# Patient Record
Sex: Female | Born: 1965 | Race: White | Hispanic: No | Smoking: Never smoker
Health system: Southern US, Community
[De-identification: ages and names within clinical notes are randomized; demographics above are authoritative.]

## PROBLEM LIST (undated history)

## (undated) DIAGNOSIS — E119 Type 2 diabetes mellitus without complications: Secondary | ICD-10-CM

## (undated) DIAGNOSIS — F419 Anxiety disorder, unspecified: Secondary | ICD-10-CM

## (undated) HISTORY — PX: TUBAL LIGATION: SHX77

## (undated) HISTORY — DX: Type 2 diabetes mellitus without complications: E11.9

---

## 2010-05-12 ENCOUNTER — Ambulatory Visit: Payer: Self-pay | Admitting: Internal Medicine

## 2011-07-23 ENCOUNTER — Ambulatory Visit: Payer: Self-pay | Admitting: Internal Medicine

## 2012-04-27 ENCOUNTER — Ambulatory Visit: Payer: Self-pay | Admitting: Psychiatry

## 2012-04-27 LAB — CBC WITH DIFFERENTIAL/PLATELET
Basophil #: 0 10*3/uL (ref 0.0–0.1)
Eosinophil #: 0.3 10*3/uL (ref 0.0–0.7)
HCT: 36.5 % (ref 35.0–47.0)
Lymphocyte %: 24.9 %
MCHC: 34.5 g/dL (ref 32.0–36.0)
Monocyte #: 0.6 x10 3/mm (ref 0.2–0.9)
Monocyte %: 7.1 %
Neutrophil #: 5 10*3/uL (ref 1.4–6.5)
Neutrophil %: 64 %
Platelet: 275 10*3/uL (ref 150–440)
RDW: 14.1 % (ref 11.5–14.5)
WBC: 7.8 10*3/uL (ref 3.6–11.0)

## 2012-04-27 LAB — BASIC METABOLIC PANEL
Chloride: 99 mmol/L (ref 98–107)
Co2: 26 mmol/L (ref 21–32)
Osmolality: 274 (ref 275–301)
Potassium: 3.6 mmol/L (ref 3.5–5.1)

## 2012-04-27 LAB — ALT: SGPT (ALT): 68 U/L

## 2012-08-23 ENCOUNTER — Ambulatory Visit: Payer: Self-pay | Admitting: Internal Medicine

## 2013-01-10 IMAGING — MG MMM DGT SCR NO ORDER W/CAD
1 series · 6 of 6 positions shown · non-contrast
Comparison: none

REASON FOR EXAM: SCRN   NO ORDER
COMMENTS:

[Series 2746: R CC · right · 6 of 6 slices shown]
[im 1/6]
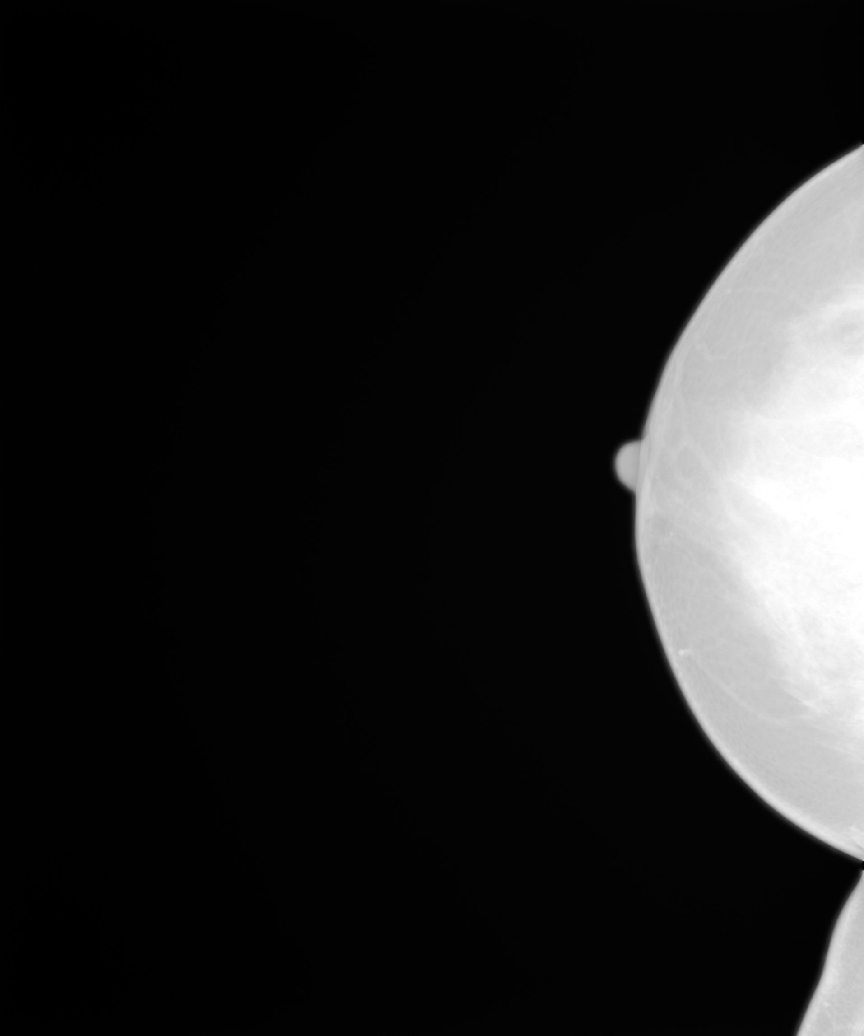
[im 2/6]
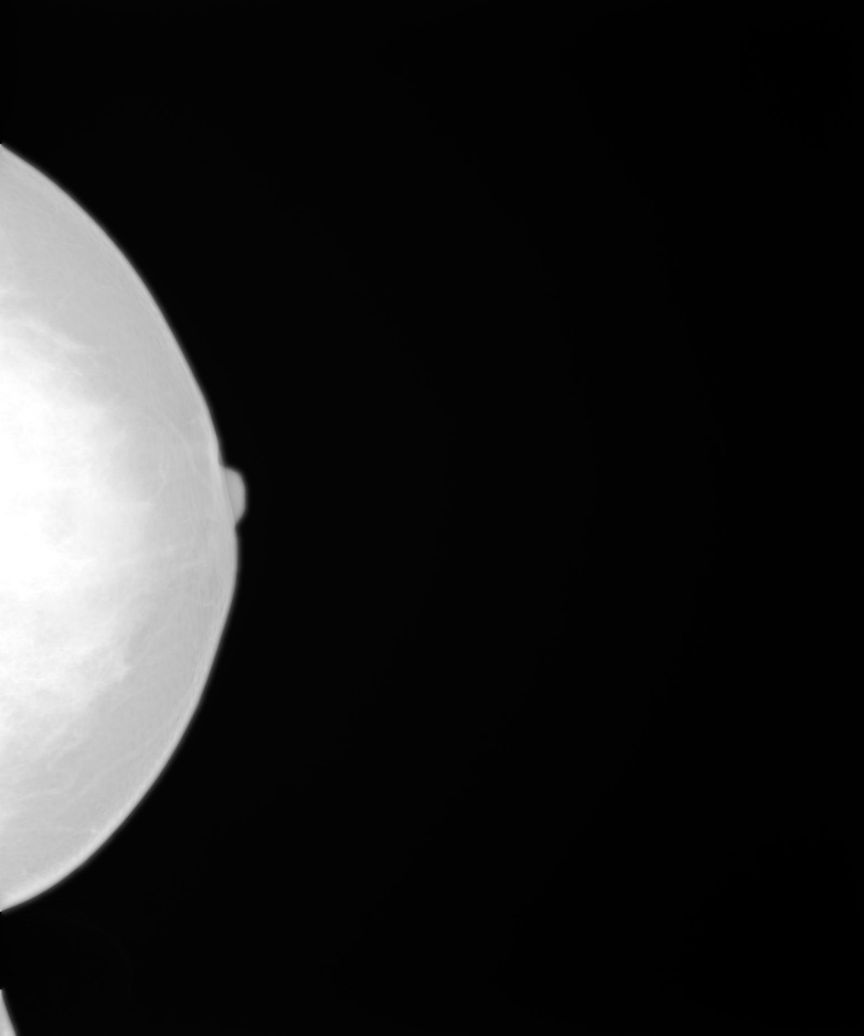
[im 3/6]
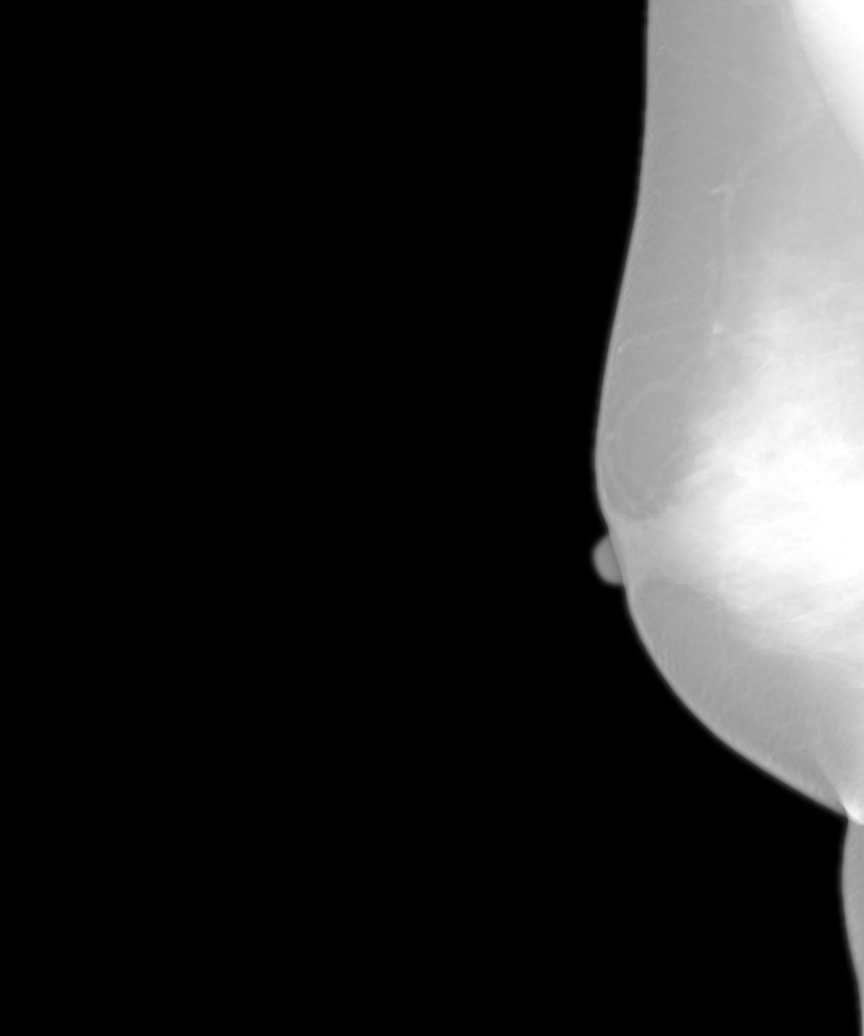
[im 4/6]
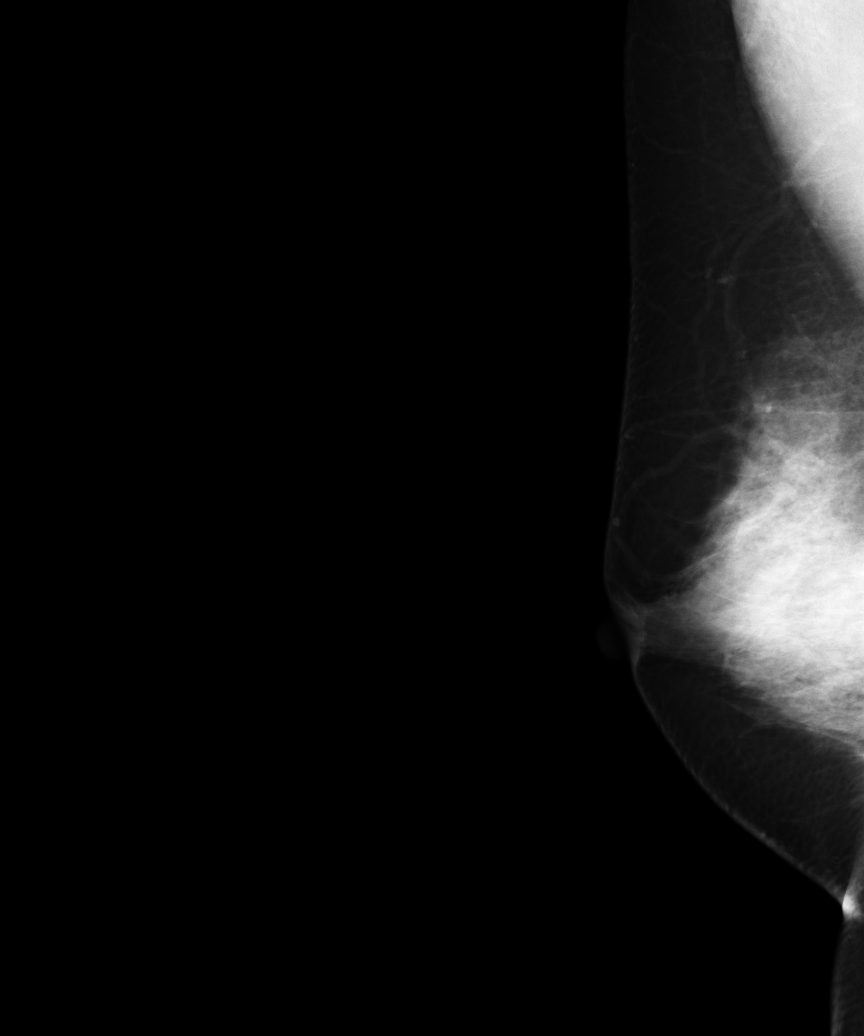
[im 5/6]
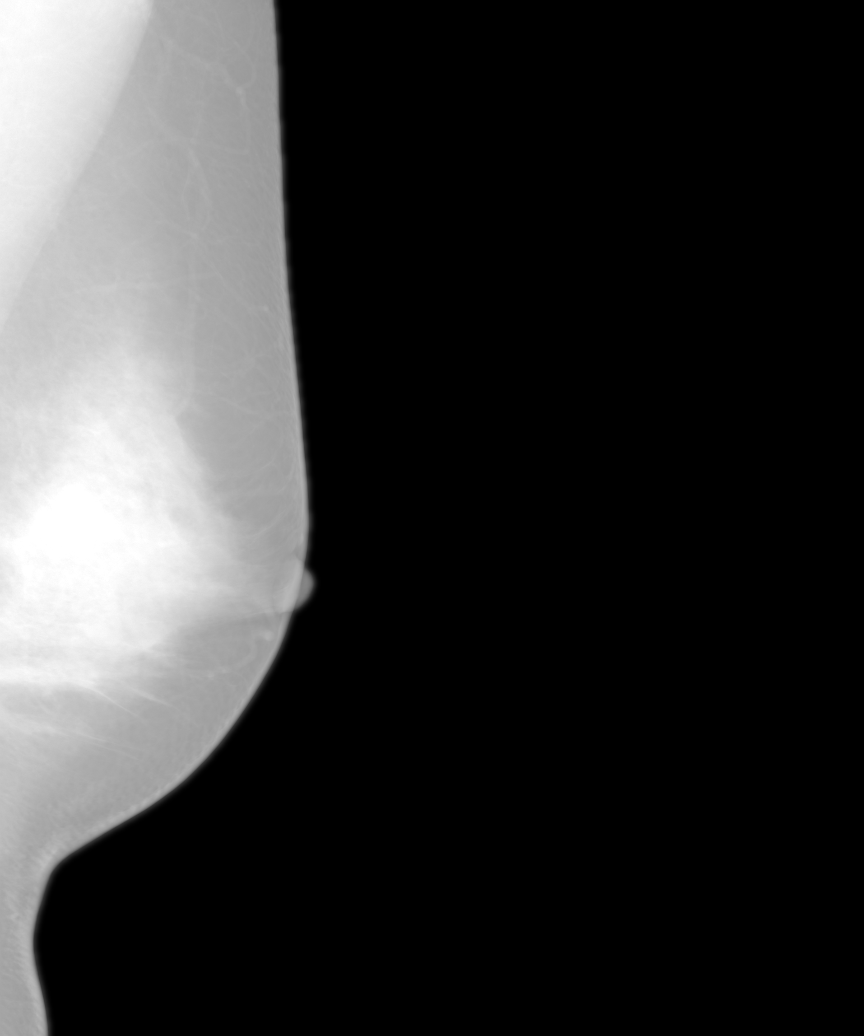
[im 6/6]
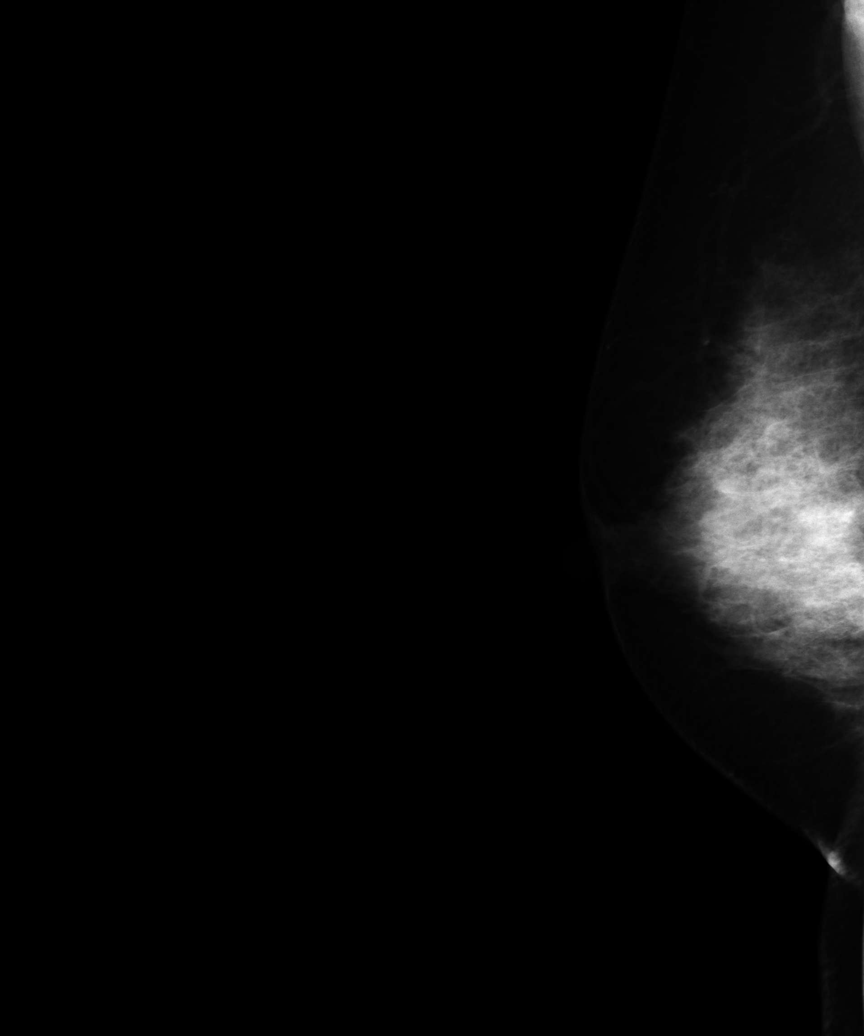

[6 of 6 positions shown; findings below may reference images not displayed]

PROCEDURE:     MMM - MMM DGT SCR NO ORDER W/CAD  - July 23, 2011 [DATE]

RESULT:     Comparison is made to a prior exam of January 10, 2010. The breast
parenchyma bilaterally is heterogeneously dense which may lower the
sensitivity of mammography. Note is made that the patient was only able to
allow minimal breast compression. No dominant mass or malignant-appearing
microcalcifications are seen.
IMPRESSION: 1. Bilaterally benign-appearing screening mammography.
2. Annual screening mammography is recommended.
3. BI-RADS: Category 1-Negative.

A negative mammogram report does not preclude biopsy or other evaluation of
a clinically palpable or otherwise suspicious mass or lesion.  Breast cancer
may not be detected by mammography in up to 10% of cases.

## 2013-04-19 ENCOUNTER — Ambulatory Visit: Payer: Self-pay | Admitting: Internal Medicine

## 2014-02-11 IMAGING — MG MM DIGITAL SCREENING BILAT W/ CAD
1 series · 4 of 4 positions shown · non-contrast
Comparison: none

REASON FOR EXAM: scr mammo no order
COMMENTS:

PROCEDURE:     MMM - MMM DGT SCR NO ORDER W/CAD  - August 23, 2012  [DATE]
RESULT:     Breast are dense. No mass. No pathologic clustered calcification
demonstrated. CAD evaluation is nonfocal. Exam is stable  from prior studies
dating back to 05/12/2010.

[R CC · right · 4 of 4 slices shown]
[im 1/4]
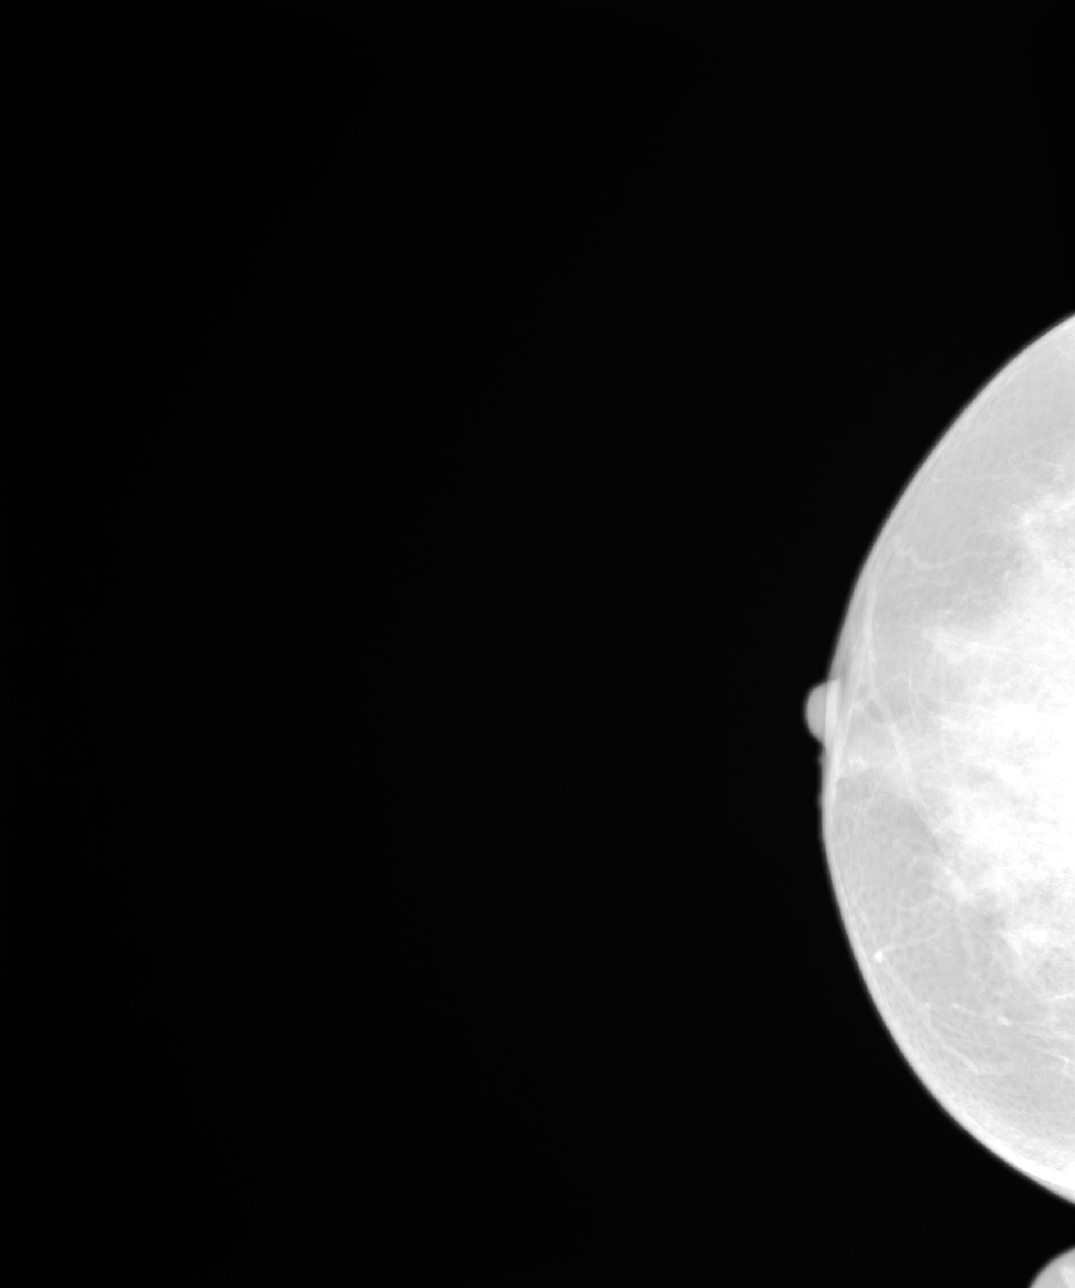
[im 2/4]
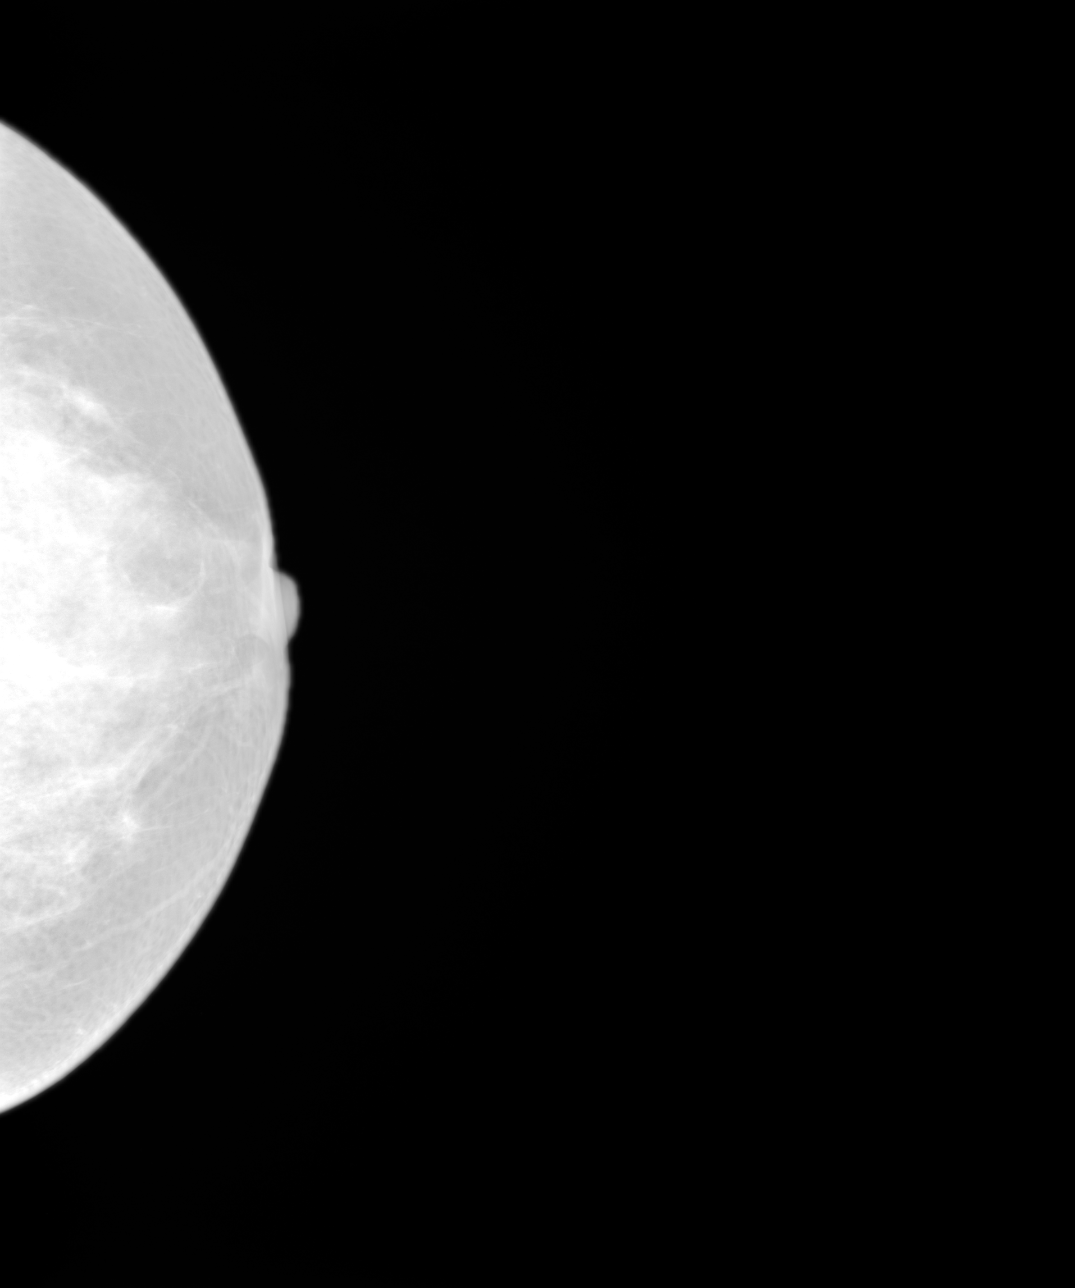
[im 3/4]
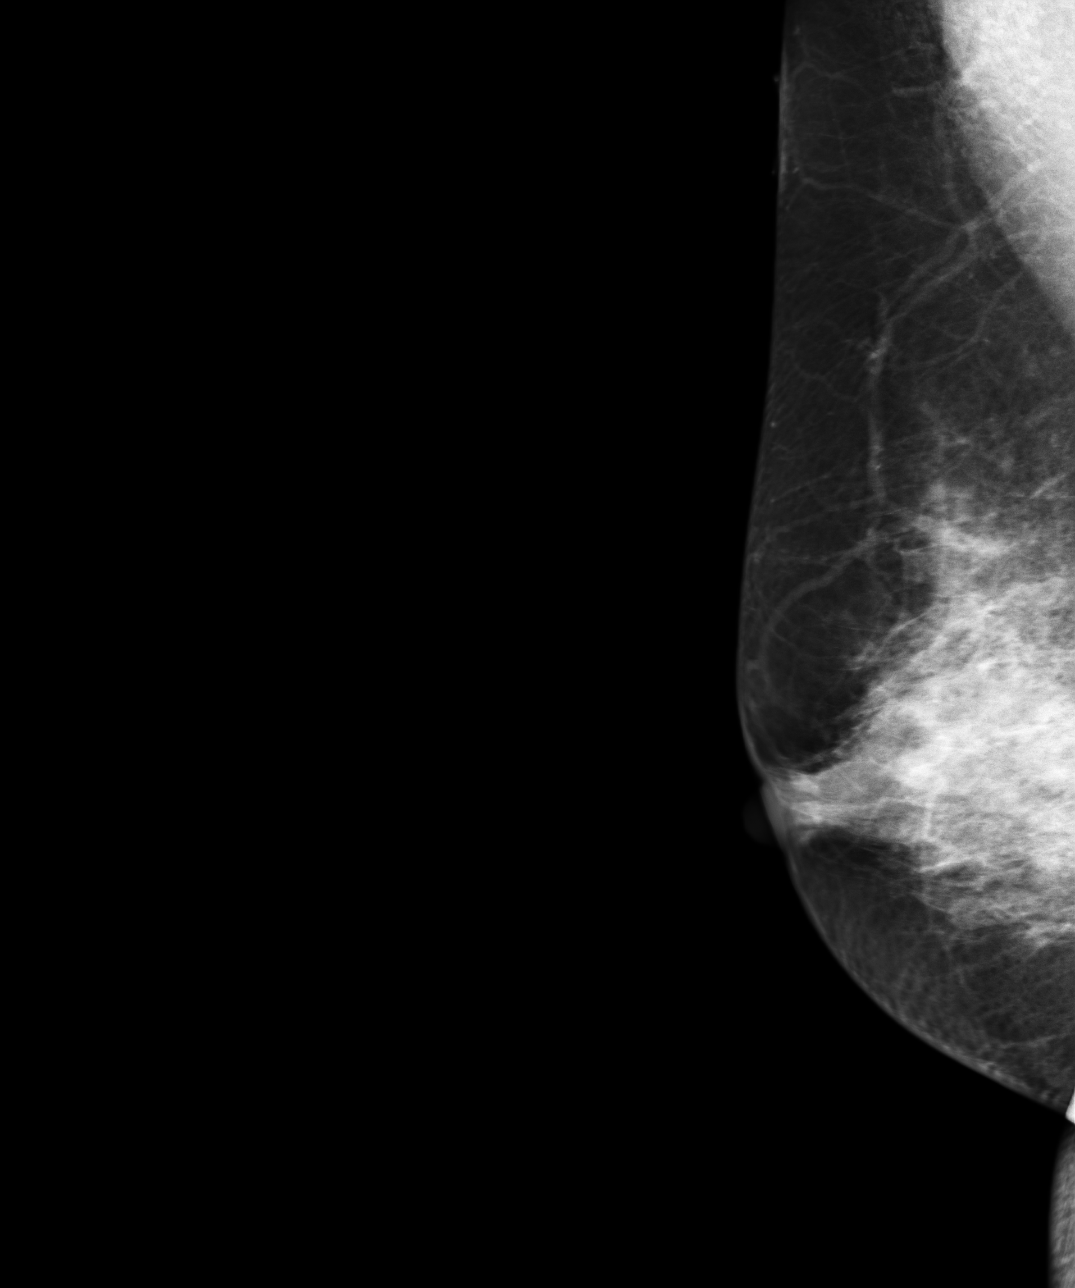
[im 4/4]
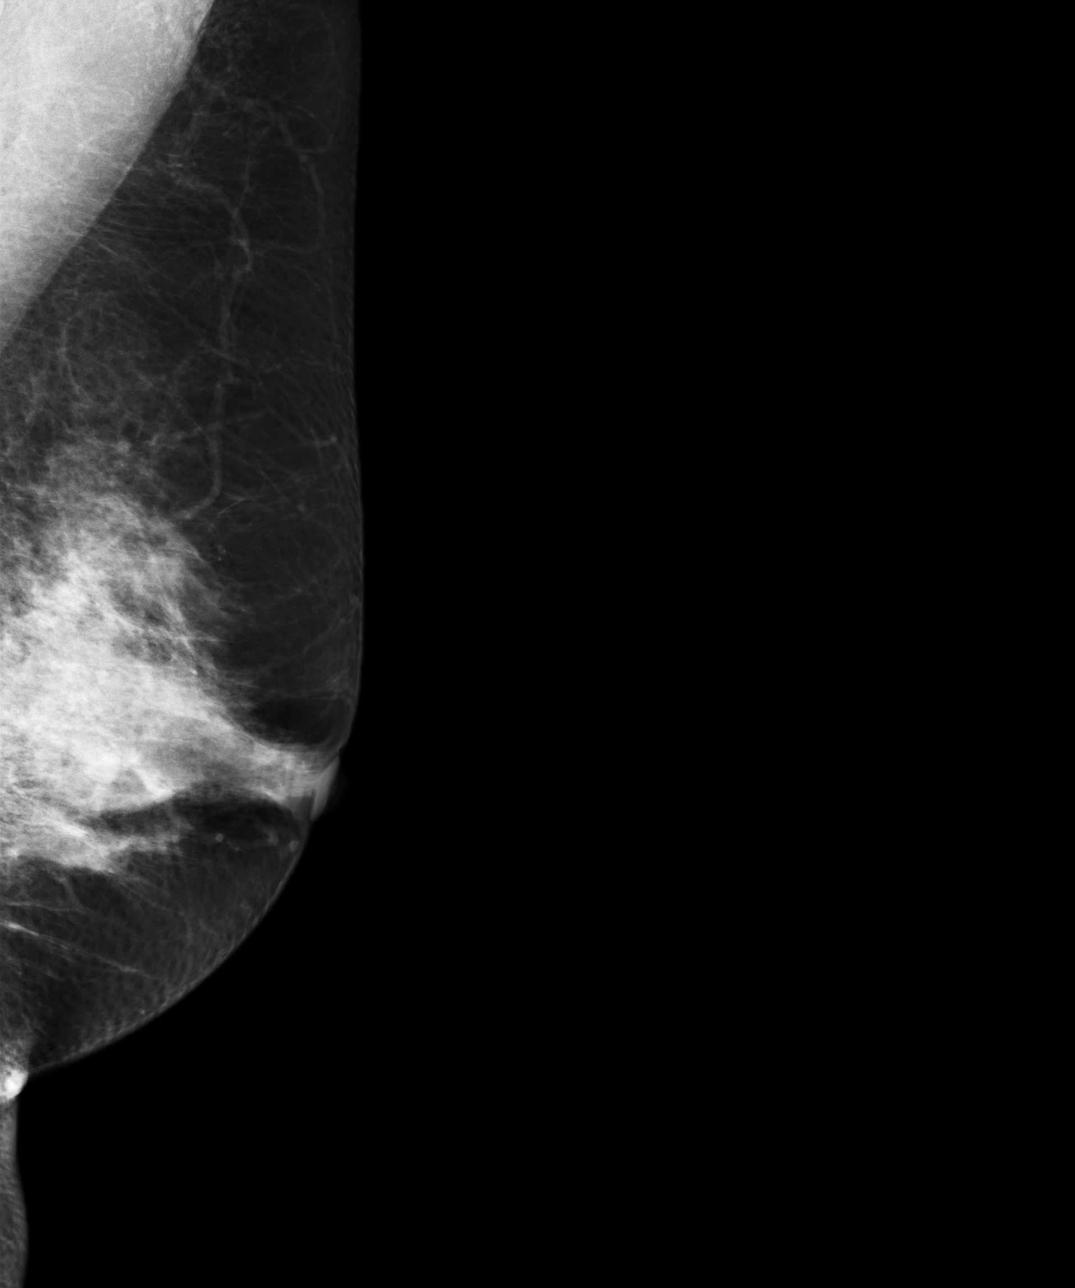

[4 of 4 positions shown; findings below may reference images not displayed]

IMPRESSION: Stable benign exam.

BI-RADS: Category 1 - Negative

A NEGATIVE MAMMOGRAM REPORT DOES NOT PRECLUDE BIOPSY OR OTHER EVALUATION OF
A CLINICALLY PALPABLE OR OTHERWISE SUSPICIOUS MASS OR LESION. BREAST CANCER
MAY NOT BE DETECTED IN UP TO 10% OF CASES.

## 2015-07-13 ENCOUNTER — Ambulatory Visit
Admission: EM | Admit: 2015-07-13 | Discharge: 2015-07-13 | Disposition: A | Discharge status: Home / Self Care | Payer: Self-pay | Attending: Family Medicine | Admitting: Family Medicine

## 2015-07-13 ENCOUNTER — Encounter: Payer: Self-pay | Admitting: Emergency Medicine

## 2015-07-13 DIAGNOSIS — K047 Periapical abscess without sinus: Principal | ICD-10-CM

## 2015-07-13 DIAGNOSIS — K029 Dental caries, unspecified: Secondary | ICD-10-CM

## 2015-07-13 MED ORDER — TRAMADOL HCL 50 MG PO TABS: 50.0000 mg | ORAL_TABLET | Freq: Three times a day (TID) | ORAL | Status: DC | PRN

## 2015-07-13 MED ORDER — AMOXICILLIN-POT CLAVULANATE 875-125 MG PO TABS: 1.0000 | ORAL_TABLET | Freq: Two times a day (BID) | ORAL | Status: DC

## 2015-07-13 NOTE — ED Provider Notes (Signed)
CSN: [id number]     Arrival date & time 07/13/15  1532 History   First MD Initiated Contact with Patient 07/13/15 1629       Nurses notes were reviewed. Chief Complaint  Patient presents with  . Dental Pain   patient states that she's has an appointment to be seen at [facility] clinic mid-November. She states that she has had difficulty with her teeth andher teeth need repair (implants replaced. She also states that yesterday night and this morning her upper left gum started throbbing in the area became very painful so she came in to be seen and evaluated. She denies smoking states that her teeth are gotten that due to the amount of sugary drinks she has tried in the past. (Consider location/radiation/quality/duration/timing/severity/associated sxs/prior Treatment) Patient is a 49 y.o. female presenting with tooth pain.  Dental Pain Location:  Upper Quality:  Localized, shooting and throbbing Severity:  Severe Onset quality:  Sudden Duration:  1 day Timing:  Constant Progression:  Worsening Chronicity:  New Context: abscess, dental caries, enamel fracture and poor dentition   Previous work-up:  Dental exam Relieved by:  Nothing Worsened by:  Cold food/drink and touching Ineffective treatments:  NSAIDs Associated symptoms: facial swelling, gum swelling and oral lesions   Associated symptoms: no neck pain   Risk factors: lack of dental care and periodontal disease    she does state that she has limitation with photons that her husband left her last year and this is what she is going to [facility] dental [facility] for evaluation.  History reviewed. No pertinent past medical history. History reviewed. No pertinent past surgical history. History reviewed. No pertinent family history. Social History  Substance Use Topics  . Smoking status: Never Smoker   . Smokeless tobacco: None  . Alcohol Use: No   OB History    No data available     Review of Systems  HENT: Positive for facial  swelling and mouth sores.   Musculoskeletal: Negative for neck pain.  All other systems reviewed and are negative.   Allergies  Review of patient's allergies indicates no known allergies.  Home Medications   Prior to Admission medications   Medication Sig Start Date End Date Taking? Authorizing Provider  ALPRAZolam Prudy Feeler) 0.5 MG tablet Take 0.5 mg by mouth at bedtime as needed for anxiety.   Yes Historical Provider, MD  sertraline (ZOLOFT) 100 MG tablet Take 100 mg by mouth daily.   Yes Historical Provider, MD  amoxicillin-clavulanate (AUGMENTIN) 875-125 MG tablet Take 1 tablet by mouth 2 (two) times daily. 07/13/15   Hassan Rowan, MD  traMADol (ULTRAM) 50 MG tablet Take 1 tablet (50 mg total) by mouth 3 (three) times daily with meals as needed. 07/13/15   Hassan Rowan, MD   Meds Ordered and Administered this Visit  Medications - No data to display  BP 145/86 mmHg  Pulse 102  Temp(Src) 98.4 F (36.9 C) (Tympanic)  Resp 20  Ht  (1.499 m)  Wt 116 lb (52.617 kg)  BMI 23.42 kg/m2  SpO2 99%  LMP 06/27/2015 No data found.   Physical Exam  Constitutional: She is oriented to person, place, and time. She appears well-developed and well-nourished.  Short white female  HENT:  Head: Normocephalic and atraumatic.  Right Ear: Hearing, tympanic membrane, external ear and ear canal normal.  Left Ear: Hearing, tympanic membrane, external ear and ear canal normal.  Nose: Nose normal.  Mouth/Throat: Mucous membranes are not pale. She does not have  dentures. Abnormal dentition. Dental abscesses and dental caries present. No uvula swelling. No oropharyngeal exudate or posterior oropharyngeal edema.  Patient has very few teeth left in her mouth the left upper gum area is swollen there is a partial to still present in that area that obviously is noted. Somewhat necrotic decayed gum around the area is swollen. There is mother areas of the gum upper lower thigh also. Be swollen and very few teeth  left in the mouth. Once they are present also look in poor repair.  Eyes: Pupils are equal, round, and reactive to light.  Neck: Neck supple. No tracheal deviation present.  Musculoskeletal: Normal range of motion.  Neurological: She is alert and oriented to person, place, and time.  Skin: Skin is warm and dry.  Psychiatric: She has a normal mood and affect.  Vitals reviewed.   ED Course  Procedures (including critical care time)  Labs Review Labs Reviewed - No data to display  Imaging Review No results found.   Visual Acuity Review  Right Eye Distance:   Left Eye Distance:   Bilateral Distance:    Right Eye Near:   Left Eye Near:    Bilateral Near:         MDM   1. Infected dental carries    Due to the marked amount of dental caries a swollen area in the mouth we'll place Augmentin 875. I did warn her that the Augmentin was going to be more expensive than the amoxicillin but due to the amount of dental caries I was concerned that the amoxicillin might not be satisfactory. Also reminded her that if the infection still present the dull school was not going to be able to do anything as far as extractions. For pain since she's only been using a lot of Excedrin will trial tramadol 50 mg 3 times a day as needed for pain. In reviewing her history is notable history of seizure disorder previously.   Hassan Rowan, MD 07/13/15 [phone]

## 2015-07-13 NOTE — Discharge Instructions (Signed)
Dental Caries °Dental caries is tooth decay. This decay can cause a hole in teeth (cavity) that can get bigger and deeper over time. °HOME CARE °· Brush and floss your teeth. Do this at least two times a day. °· Use a fluoride toothpaste. °· Use a mouth rinse if told by your dentist or doctor. °· Eat less sugary and starchy foods. Drink less sugary drinks. °· Avoid snacking often on sugary and starchy foods. Avoid sipping often on sugary drinks. °· Keep regular checkups and cleanings with your dentist. °· Use fluoride supplements if told by your dentist or doctor. °· Allow fluoride to be applied to teeth if told by your dentist or doctor. °  °This information is not intended to replace advice given to you by your health care provider. Make sure you discuss any questions you have with your health care provider. °  °Document Released: 06/30/2008 Document Revised: 10/12/2014 Document Reviewed: 09/23/2012 °Elsevier Interactive Patient Education ©2016 [employer] ° °Dental Pain °Dental pain may be caused by many things, including: °· Tooth decay (cavities or caries). Cavities cause the nerve of your tooth to be open to air and hot or cold temperatures. This can cause pain or discomfort. °· Abscess or infection. A dental abscess is an area that is full of infected pus from a bacterial infection in the inner part of the tooth (pulp). It usually happens at the end of the tooth's root. °· Injury. °· An unknown reason (idiopathic). °Your pain may be mild or severe. It may only happen when: °· You are chewing. °· You are exposed to hot or cold temperature. °· You are eating or drinking sugary foods or beverages, such as: °¨ Soda. °¨ Candy. °Your pain may also be there all of the time. °HOME CARE °Watch your dental pain for any changes. Do these things to lessen your discomfort: °· Take medicines only as told by your dentist. °· If your dentist tells you to take an antibiotic medicine, finish all of it even if you start to  feel better. °· Keep all follow-up visits as told by your dentist. This is important. °· Do not apply heat to the outside of your face. °· Rinse your mouth or gargle with salt water if told by your dentist. This helps with pain and swelling. °¨ You can make salt water by adding ¼ tsp of salt to 1 cup of warm water. °· Apply ice to the painful area of your face: °¨ Put ice in a plastic bag. °¨ Place a towel between your skin and the bag. °¨ Leave the ice on for 20 minutes, 2-3 times per day. °· Avoid foods or drinks that cause you pain, such as: °¨ Very hot or very cold foods or drinks. °¨ Sweet or sugary foods or drinks. °GET HELP IF: °· Your pain is not helped with medicines. °· Your symptoms are worse. °· You have new symptoms. °GET HELP RIGHT AWAY IF: °· You cannot open your mouth. °· You are having trouble breathing or swallowing. °· You have a fever. °· Your face, neck, or jaw is puffy (swollen). °  °This information is not intended to replace advice given to you by your health care provider. Make sure you discuss any questions you have with your health care provider. °  °Document Released: 03/09/2008 Document Revised: 02/05/2015 Document Reviewed: 09/17/2014 °Elsevier Interactive Patient Education ©2016 Elsevier Inc. ° °

## 2015-07-13 NOTE — ED Notes (Signed)
Pt with a toothache x 7 days

## 2021-02-06 ENCOUNTER — Other Ambulatory Visit: Payer: Self-pay

## 2021-02-06 ENCOUNTER — Ambulatory Visit
Admission: EM | Admit: 2021-02-06 | Discharge: 2021-02-06 | Disposition: A | Discharge status: Home / Self Care | Payer: Self-pay | Attending: Physician Assistant | Admitting: Physician Assistant

## 2021-02-06 DIAGNOSIS — Z111 Encounter for screening for respiratory tuberculosis: Secondary | ICD-10-CM

## 2021-02-06 MED ORDER — TUBERCULIN PPD 5 UNIT/0.1ML ID SOLN
5.0000 [IU] | Freq: Once | INTRADERMAL | Status: DC
  Administered 2021-02-06: 5 [IU] via INTRADERMAL

## 2021-02-06 NOTE — ED Triage Notes (Signed)
Pt presents to [facility] for TB skin testing.

## 2021-02-06 NOTE — Discharge Instructions (Signed)
Return in 48-72 hours for TB reading.

## 2021-02-08 ENCOUNTER — Other Ambulatory Visit: Payer: Self-pay

## 2021-02-08 ENCOUNTER — Ambulatory Visit
Admission: EM | Admit: 2021-02-08 | Discharge: 2021-02-08 | Disposition: A | Discharge status: Home / Self Care | Payer: Self-pay | Attending: Family Medicine | Admitting: Family Medicine

## 2021-02-08 DIAGNOSIS — Z111 Encounter for screening for respiratory tuberculosis: Secondary | ICD-10-CM

## 2021-02-08 NOTE — ED Notes (Addendum)
PPD skin test read on 02/08/2021 at 1327 and was 75mm Negative on right forearm.

## 2021-11-14 ENCOUNTER — Ambulatory Visit (INDEPENDENT_AMBULATORY_CARE_PROVIDER_SITE_OTHER): Payer: Self-pay

## 2021-11-14 ENCOUNTER — Ambulatory Visit
Admission: EM | Admit: 2021-11-14 | Discharge: 2021-11-14 | Disposition: A | Discharge status: Other Acute Inpatient Hospital | Payer: Self-pay | Attending: Emergency Medicine | Admitting: Emergency Medicine

## 2021-11-14 DIAGNOSIS — R1031 Right lower quadrant pain: Secondary | ICD-10-CM | POA: Insufficient documentation

## 2021-11-14 DIAGNOSIS — R109 Unspecified abdominal pain: Secondary | ICD-10-CM

## 2021-11-14 LAB — URINALYSIS, ROUTINE W REFLEX MICROSCOPIC
Bilirubin Urine: NEGATIVE
Glucose, UA: 100 mg/dL — AB
Ketones, ur: 15 mg/dL — AB
Leukocytes,Ua: NEGATIVE
Nitrite: NEGATIVE
Protein, ur: 30 mg/dL — AB
Specific Gravity, Urine: 1.02 (ref 1.005–1.030)
pH: 8.5 — ABNORMAL HIGH (ref 5.0–8.0)

## 2021-11-14 LAB — URINALYSIS, MICROSCOPIC (REFLEX)

## 2021-11-14 NOTE — Discharge Instructions (Addendum)
Please go directly to [facility] via POV for further evaluation of your abdominal pain and the densities seen on your x-ray.  The radiology is recommending a CT scan and this will also give better characterization of your renal stone burden.

## 2021-11-14 NOTE — ED Provider Notes (Signed)
[facility]    CSN: [id number] Arrival date & time: 11/14/21  0807      History   Chief Complaint Chief Complaint  Patient presents with   Abdominal Pain   Flank Pain    HPI Christy Stone is a 56 y.o. female.   HPI  56 year old female here for evaluation of abdominal complaints.  Patient reports that her pain started last night in the lower central part of her abdomen, more suprapubic, and then began to radiate to her right flank.  This morning she developed nausea and vomiting that is mostly mucus and bilious vomit.  No food or blood.  She had a bowel movement last night and this morning that she describes as being diarrhea but denies any blood.  She denies any fever at home though she does have a mildly elevated temp of 99.1 here in clinic.  She denies painful urination, urinary urgency or frequency, or blood in her urine.  Reports that she has had a good appetite.  History reviewed. No pertinent past medical history.  There are no problems to display for this patient.   History reviewed. No pertinent surgical history.  OB History   No obstetric history on file.      Home Medications    Prior to Admission medications   Medication Sig Start Date End Date Taking? Authorizing Provider  ALPRAZolam Duanne Moron) 0.5 MG tablet Take 0.5 mg by mouth at bedtime as needed for anxiety.   Yes [provider]  sertraline (ZOLOFT) 100 MG tablet Take 100 mg by mouth daily.   Yes [provider]    Family History History reviewed. No pertinent family history.  Social History Social History   Tobacco Use   Smoking status: Never  Substance Use Topics   Alcohol use: No     Allergies   Patient has no known allergies.   Review of Systems Review of Systems  Constitutional:  Negative for activity change, appetite change and fever.  Gastrointestinal:  Positive for abdominal pain, diarrhea, nausea and vomiting.  Genitourinary:  Positive for  flank pain. Negative for decreased urine volume, dysuria, frequency and hematuria.  Skin:  Negative for rash.  Hematological: Negative.   Psychiatric/Behavioral: Negative.      Physical Exam Triage Vital Signs ED Triage Vitals  Enc Vitals Group     BP 11/14/21 0819 (!) 148/82     Pulse Rate 11/14/21 0819 85     Resp 11/14/21 0819 18     Temp 11/14/21 0819 99.1 F (37.3 C)     Temp Source 11/14/21 0819 Oral     SpO2 11/14/21 0819 98 %     Weight 11/14/21 0818 110 lb (49.9 kg)     Height 11/14/21 0818 4\' 9"  (1.448 m)     Head Circumference --      Peak Flow --      Pain Score 11/14/21 0818 7     Pain Loc --      Pain Edu? --      Excl. in [place]? --    No data found.  Updated Vital Signs BP (!) 148/82 (BP Location: Left Arm)    Pulse 85    Temp 99.1 F (37.3 C) (Oral)    Resp 18    Ht 4\' 9"  (1.448 m)    Wt 110 lb (49.9 kg)    SpO2 98%    BMI 23.80 kg/m   Visual Acuity Right Eye Distance:   Left Eye  Distance:   Bilateral Distance:    Right Eye Near:   Left Eye Near:    Bilateral Near:     Physical Exam Vitals and nursing note reviewed.  Constitutional:      General: She is in acute distress.     Appearance: She is well-developed. She is not ill-appearing.  HENT:     Head: Normocephalic and atraumatic.  Cardiovascular:     Rate and Rhythm: Normal rate and regular rhythm.     Heart sounds: Normal heart sounds. No murmur heard.   No friction rub. No gallop.  Pulmonary:     Effort: Pulmonary effort is normal.     Breath sounds: Normal breath sounds. No wheezing, rhonchi or rales.  Abdominal:     General: Abdomen is protuberant. There is no distension.     Palpations: Abdomen is soft.     Tenderness: There is abdominal tenderness in the right lower quadrant. There is no right CVA tenderness, left CVA tenderness, guarding or rebound.  Skin:    General: Skin is warm and dry.     Capillary Refill: Capillary refill takes less than 2 seconds.     Findings: No erythema or  rash.  Neurological:     General: No focal deficit present.     Mental Status: She is alert and oriented to person, place, and time.  Psychiatric:        Mood and Affect: Mood normal.        Behavior: Behavior normal.     UC Treatments / Results  Labs (all labs ordered are listed, but only abnormal results are displayed) Labs Reviewed  URINALYSIS, ROUTINE W REFLEX MICROSCOPIC - Abnormal; Notable for the following components:      Result Value   APPearance CLOUDY (*)    pH 8.5 (*)    Glucose, UA 100 (*)    Hgb urine dipstick TRACE (*)    Ketones, ur 15 (*)    Protein, ur 30 (*)    All other components within normal limits  URINALYSIS, MICROSCOPIC (REFLEX) - Abnormal; Notable for the following components:   Bacteria, UA MANY (*)    All other components within normal limits    EKG   Radiology DG Abdomen 1 View  Result Date: 11/14/2021 CLINICAL DATA:  Right flank pain. EXAM: ABDOMEN - 1 VIEW COMPARISON:  None. FINDINGS: There is a 2 mm calcification overlying the lower pole of the right renal shadow. Additional tiny 1-2 mm radiopaque densities overlying the left mid and lower kidney. Multiple pelvic phleboliths. No evidence of bowel obstruction. There are rounded densities overlying the left upper quadrant measuring 2.7 and 1.2 cm. There are additional 1.5 x 1.6 cm nodular densities overlying the pelvis. IMPRESSION: 2 mm calcification overlying the lower pole of the right kidney and additional tiny 1-2 mm radiopaque densities overlying the left mid and lower kidney, potentially representing renal stones. Nodular densities overlying the left upper quadrant and pelvis of unclear etiology, nonspecific on single frontal abdominal radiograph. Recommend CT of the abdomen pelvis for further evaluation. Potential renal stone burden could also be assessed with CT. Electronically Signed   By: Maurine Simmering M.D.   On: 11/14/2021 09:13    Procedures Procedures (including critical care  time)  Medications Ordered in UC Medications - No data to display  Initial Impression / Assessment and Plan / UC Course  I have reviewed the triage vital signs and the nursing notes.  Pertinent labs & imaging results that were  available during my care of the patient were reviewed by me and considered in my medical decision making (see chart for details).  Patient is a pleasant 33 to female who does appear to be in a moderate degree of pain as she is pacing around the exam room here for evaluation of suprapubic/right lower quad abdominal pain that developed last night and has since radiated up to her right flank.  Patient states that she thought it was because she was constipated but she had a diarrhea bowel movement yesterday and the pain improved somewhat but returned this morning.  When it returned this morning patient also developed nausea and vomiting for mucus and bilious fluid.  She denies any blood in her emesis.  She also denies any blood in her urine, pain with urination, or urinary urgency or frequency.  Urinalysis was collected at triage and is pending.  On exam, which had to be performed standing up as patient cannot lay down and it hurts to stand still, patient has a benign cardiopulmonary exam with clear lung sounds in all fields.  No CVA tenderness on exam.  Patient does have a right lower quadrant abdominal pain.  Her abdomen is protuberant but soft.  No guarding or rebound appreciated on exam.  We will also order KUB to look for potential renal stone.  If KUB and urinalysis are negative patient will be directed to the emergency department to rule out appendicitis.  Urinalysis reports cloudy appearance, elevated pH of 8.5, 100 glucose, trace hemoglobin, 15 ketones, 30 protein.  Negative for neck Trites or leukocyte esterase.  The micro shows many bacteria but no squamous epithelials or WBCs.  Amorphous crystals are present.  KUB independently reviewed and evaluated by me.  Patient has  scattered stool throughout her descending colon.  Nonspecific bowel gas pattern.  There are 2 round densities in the pelvis.  Multiple phleboliths present.  Radiology overread is pending. Radiology impression is that there is a 2 mm calcification overlying the lower pole of the right renal shadow, additionally tiny 1 to 2 mm radiopaque densities overlying the left mid and lower kidney.  Multiple pelvic phleboliths.  No evidence of bowel obstruction.  There are rounded densities overlying the left upper quadrant measuring 2.7 and 1.2 cm respectively.  There are additional 1.5 x 1.6 cm nodular densities overlying the pelvis.  Recommend CT of the abdomen pelvis for further evaluation.  Tension renal stone burden can also be assessed with CT.  Due to patient's acute pain, and the degree of pain, I Minna recommend she go to the emergency department as we do not have the ability to perform the CT here today.  The etiology of her pain is unclear.  It may possibly be renal stones but then again it may be coming from what ever these round densities are in the pelvic region where her pain originated.  Patient is continuing to have nausea and vomiting as well as pain that I am unable to control in the urgent care.  Patient is agreeable to go to the emergency department.  Her mom will drive her via POV to [facility] as the patient does not have insurance.   Final Clinical Impressions(s) / UC Diagnoses   Final diagnoses:  Right lower quadrant abdominal pain     Discharge Instructions      Please go directly to [facility] via POV for further evaluation of your abdominal pain and the densities seen on your x-ray.  The radiology is  recommending a CT scan and this will also give better characterization of your renal stone burden.     ED Prescriptions   None    PDMP not reviewed this encounter.   Margarette Canada, NP 11/14/21 0930

## 2021-11-14 NOTE — ED Triage Notes (Signed)
Pt here with C/O right sided pain. Lower abdominal pain started yesterday and has since spread to right side.

## 2021-11-14 NOTE — ED Notes (Signed)
Patient is being discharged from the Urgent Care and sent to the Emergency Department via POV . Per Becky Augusta NP, patient is in need of higher level of care due to further workup for abdominal pain. Patient is aware and verbalizes understanding of plan of care.  Vitals:   11/14/21 0819  BP: (!) 148/82  Pulse: 85  Resp: 18  Temp: 99.1 F (37.3 C)  SpO2: 98%

## 2021-12-11 ENCOUNTER — Encounter (HOSPITAL_COMMUNITY): Payer: Self-pay | Admitting: Radiology

## 2021-12-22 ENCOUNTER — Telehealth: Payer: Self-pay | Admitting: Emergency Medicine

## 2021-12-22 DIAGNOSIS — R9389 Abnormal findings on diagnostic imaging of other specified body structures: Secondary | ICD-10-CM

## 2021-12-22 DIAGNOSIS — N200 Calculus of kidney: Secondary | ICD-10-CM

## 2021-12-22 NOTE — Progress Notes (Addendum)
?Virtual Visit Consent  ? ?[name], you are scheduled for a virtual visit with a [facility] provider today.   ?  ?Just as with appointments in the office, your consent must be obtained to participate.  Your consent will be active for this visit and any virtual visit you may have with one of our providers in the next 365 days.   ?  ?If you have a MyChart account, a copy of this consent can be sent to you electronically.  All virtual visits are billed to your insurance company just like a traditional visit in the office.   ? ?As this is a virtual visit, video technology does not allow for your provider to perform a traditional examination.  This may limit your provider's ability to fully assess your condition.  If your provider identifies any concerns that need to be evaluated in person or the need to arrange testing (such as labs, EKG, etc.), we will make arrangements to do so.   ?  ?Although advances in technology are sophisticated, we cannot ensure that it will always work on either your end or our end.  If the connection with a video visit is poor, the visit may have to be switched to a telephone visit.  With either a video or telephone visit, we are not always able to ensure that we have a secure connection.    ? ?I need to obtain your verbal consent now.   Are you willing to proceed with your visit today?  ?  ?Christy Stone has provided verbal consent on 12/22/2021 for a virtual visit (video or telephone). ?  ?Cathlyn Parsons, NP  ? ?Date: 12/22/2021 1:13 PM ? ? ?Virtual Visit via Video Note  ? ?ICathlyn Parsons, connected with  Christy Stone  ([id number], [birth year]) on 12/22/21 at  1:00 PM EDT by a video-enabled telemedicine application and verified that I am speaking with the correct person using two identifiers. ? ?Location: ?Patient: Virtual Visit Location Patient: Home ?Provider: Virtual Visit Location Provider: Home Office ?  ?I discussed the limitations of evaluation and management by  telemedicine and the availability of in person appointments. The patient expressed understanding and agreed to proceed.   ? ?History of Present Illness: ?Christy Stone is a 56 y.o. who identifies as a female who was assigned female at birth, and is being seen today for f/u abnormal xray results.  Patient seen in urgent care 11/14/2021 for abdominal and right flank pain.  Abdominal x-ray indicated possible renal stone.  Patient was instructed to go to the ED but she did not do that.  She reports symptoms from that episode have completely resolved and she has felt fine since then; she feels well at this time with no abd or flank pain, no weight loss or gain, and no nausea or vomiting.  ? ?1 view abdominal x-ray 11/14/2021 showed: ?2 mm calcification overlying the lower pole of the right kidney and additional tiny 1-2 mm radiopaque densities overlying the left mid and lower kidney, potentially representing renal stones. ?  ?Nodular densities overlying the left upper quadrant and pelvis of unclear etiology, nonspecific on single frontal abdominal radiograph. Recommend CT of the abdomen pelvis for further evaluation. Potential renal stone burden could also be assessed with CT. ? ? ? ?HPI: HPI  ?Problems: There are no problems to display for this patient. ?  ?Allergies: No Known Allergies ?Medications:  ?Current Outpatient Medications:  ?  ALPRAZolam (XANAX) 0.5 MG  tablet, Take 0.5 mg by mouth at bedtime as needed for anxiety., Disp: , Rfl:  ?  sertraline (ZOLOFT) 100 MG tablet, Take 100 mg by mouth daily., Disp: , Rfl:  ? ?Observations/Objective: ?Patient is well-developed, well-nourished in no acute distress.  ?Resting comfortably  at home.  ?Head is normocephalic, atraumatic.  ?No labored breathing.  ?Speech is clear and coherent with logical content.  ?Patient is alert and oriented at baseline.  ? ? ?Assessment and Plan: ?1. Nodular radiologic density ?- CT Abdomen Pelvis Wo Contrast; Future ? ?2. Nephrolithiasis ?-  CT Abdomen Pelvis Wo Contrast; Future ? ?CT abd pelvis without contrast ordered as recommended by radiologist.  I will follow-up with patient with results. ? ?Addendum 12/26/21: Pt was scheduled for CT 12/24/21 but cancelled it. Amy Sherlon Handing in our department followed up with the pt who reported she spoke with her gyn and "they know what it is" so no need for f/u CT. Will cancel CT in system.  ? ?Follow Up Instructions: ?I discussed the assessment and treatment plan with the patient. The patient was provided an opportunity to ask questions and all were answered. The patient agreed with the plan and demonstrated an understanding of the instructions.  A copy of instructions were sent to the patient via MyChart unless otherwise noted below.  ? ?The patient was advised to call back or seek an in-person evaluation if the symptoms worsen or if the condition fails to improve as anticipated. ? ?Time:  ?I spent 10 minutes with the patient via telehealth technology discussing the above problems/concerns.   ? ?Cathlyn Parsons, NP ?

## 2021-12-24 ENCOUNTER — Ambulatory Visit: Payer: Self-pay

## 2021-12-26 NOTE — Addendum Note (Signed)
Addended by: Carvel Getting on: 12/26/2021 09:09 AM ? ? Modules accepted: Orders ? ?

## 2023-05-05 IMAGING — CR DG ABDOMEN 1V
1 series · 1 of 1 positions shown · non-contrast
Comparison: None.

CLINICAL DATA: Right flank pain.

EXAM:
ABDOMEN - 1 VIEW

[abdomen kub]
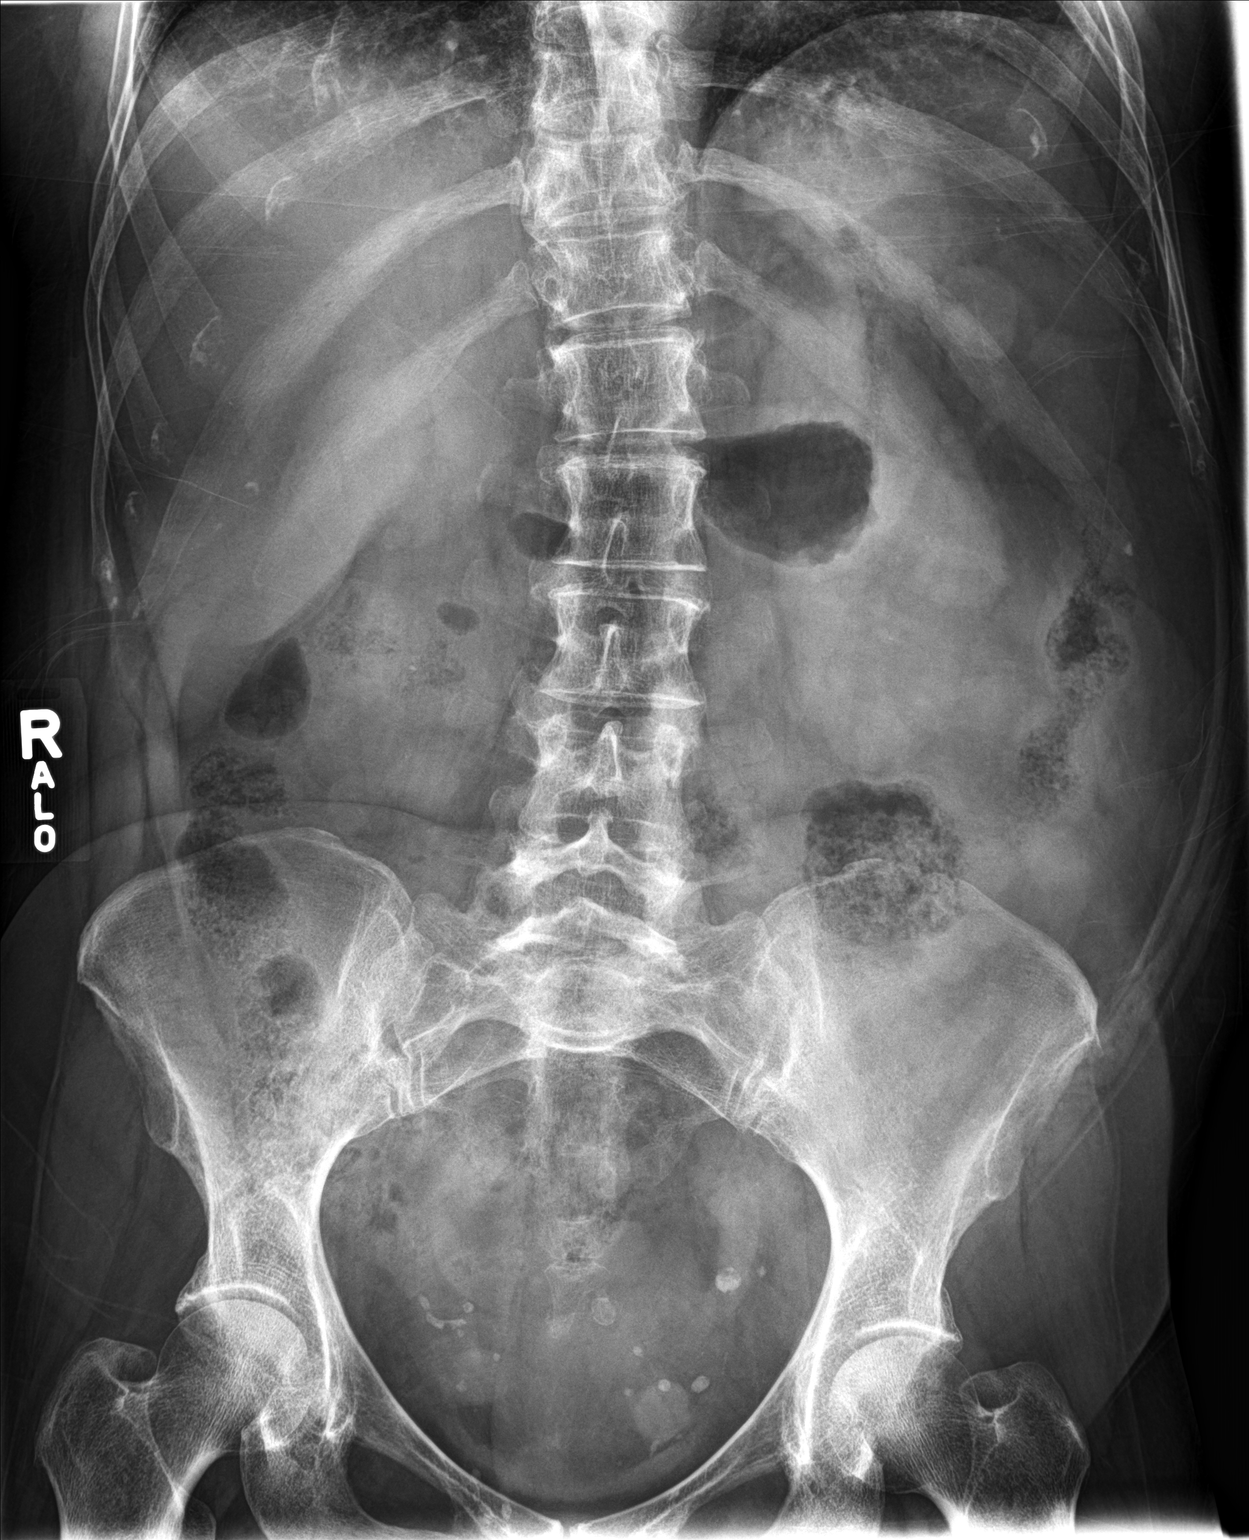

[1 of 1 positions shown; findings below may reference images not displayed]

FINDINGS: There is a 2 mm calcification overlying the lower pole of the right
renal shadow. Additional tiny 1-2 mm radiopaque densities overlying
the left mid and lower kidney. Multiple pelvic phleboliths. No
evidence of bowel obstruction. There are rounded densities overlying
the left upper quadrant measuring 2.7 and 1.2 cm. There are
additional 1.5 x 1.6 cm nodular densities overlying the pelvis.
IMPRESSION: 2 mm calcification overlying the lower pole of the right kidney and
additional tiny 1-2 mm radiopaque densities overlying the left mid
and lower kidney, potentially representing renal stones.

Nodular densities overlying the left upper quadrant and pelvis of
unclear etiology, nonspecific on single frontal abdominal
radiograph. Recommend CT of the abdomen pelvis for further
evaluation. Potential renal stone burden could also be assessed with
CT.

## 2023-08-31 ENCOUNTER — Ambulatory Visit
Admission: EM | Admit: 2023-08-31 | Discharge: 2023-08-31 | Disposition: A | Discharge status: Home / Self Care | Payer: BLUE CROSS/BLUE SHIELD

## 2023-08-31 DIAGNOSIS — E119 Type 2 diabetes mellitus without complications: Secondary | ICD-10-CM | POA: Diagnosis not present

## 2023-08-31 DIAGNOSIS — U071 COVID-19: Secondary | ICD-10-CM | POA: Diagnosis present

## 2023-08-31 DIAGNOSIS — F419 Anxiety disorder, unspecified: Secondary | ICD-10-CM | POA: Insufficient documentation

## 2023-08-31 HISTORY — DX: Type 2 diabetes mellitus without complications: E11.9

## 2023-08-31 HISTORY — DX: Anxiety disorder, unspecified: F41.9

## 2023-08-31 LAB — RESP PANEL BY RT-PCR (FLU A&B, COVID) ARPGX2
Influenza A by PCR: NEGATIVE
Influenza B by PCR: NEGATIVE
SARS Coronavirus 2 by RT PCR: POSITIVE — AB

## 2023-08-31 MED ORDER — PROMETHAZINE-DM 6.25-15 MG/5ML PO SYRP: 5.0000 mL | ORAL_SOLUTION | Freq: Four times a day (QID) | ORAL | 0 refills | Status: DC | PRN

## 2023-08-31 MED ORDER — BENZONATATE 100 MG PO CAPS: 200.0000 mg | ORAL_CAPSULE | Freq: Three times a day (TID) | ORAL | 0 refills | Status: DC

## 2023-08-31 MED ORDER — IPRATROPIUM BROMIDE 0.06 % NA SOLN
2.0000 | Freq: Four times a day (QID) | NASAL | 12 refills | Status: DC
Start: 2023-08-31 — End: 2024-06-07

## 2023-08-31 NOTE — Discharge Instructions (Addendum)
CDC guidelines state that you must wear a mask for the first 5 days of symptoms when you are around other people.  After 5 days you no longer need to mask as you are no longer considered infectious.  There is no longer need to quarantine unless you have a fever.  If you do have a fever then you need to quarantine until you have been fever free for 24 hours without taking Tylenol and/or ibuprofen.  Use over-the-counter Tylenol and/or ibuprofen according to the package instructions as needed for fever and pain.  Use the Atrovent nasal spray, 2 squirts up each nostril every 6 hours, as needed for nasal congestion and runny nose.  Use the Tessalon Perles every 8 hours during the day as needed for cough.  Take them with a small sip of water.  You may experience numbness to the base of your tongue or metallic taste in her mouth, this is normal.  Use the Promethazine DM cough syrup at bedtime as needed for cough and congestion.  Be mindful this medication will make you sleepy.  If you develop any worsening respiratory symptoms such as shortness of breath, shortness of breath at rest, feel as though you cannot catch your breath, you are unable to speak in full sentences, or, as a late sign, your lips begin turning blue you need to call 911 and go to the ER for evaluation.

## 2023-08-31 NOTE — ED Triage Notes (Signed)
Came home from work yesterday evening and started feeling chills-body aches-runny nose-fever. Took fever and it was 98.? Patient staets that she felt hot.

## 2023-08-31 NOTE — ED Provider Notes (Signed)
[facility]    CSN: [id number] Arrival date & time: 08/31/23  0844      History   Chief Complaint Chief Complaint  Patient presents with   Cough   Generalized Body Aches    HPI Christy Stone is a 57 y.o. female.   HPI  57 year old female with past medical history significant for diabetes and anxiety presents for evaluation of cold symptoms which began yesterday when she got home from work.  Her symptoms include subjective fever, runny nose, chills, body aches, and nonproductive cough.  She denies any sore throat, ear pain, shortness breath, or wheezing.  No international travel.  She reports that multiple coworkers have had similar symptoms.  Past Medical History:  Diagnosis Date   Anxiety    Diabetes mellitus without complication (HCC)     There are no problems to display for this patient.   History reviewed. No pertinent surgical history.  OB History   No obstetric history on file.      Home Medications    Prior to Admission medications   Medication Sig Start Date End Date Taking? Authorizing Provider  ALPRAZolam Prudy Feeler) 0.5 MG tablet Take 0.5 mg by mouth at bedtime as needed for anxiety.   Yes [provider]  benzonatate (TESSALON) 100 MG capsule Take 2 capsules (200 mg total) by mouth every 8 (eight) hours. 08/31/23  Yes Becky Augusta, NP  ipratropium (ATROVENT) 0.06 % nasal spray Place 2 sprays into both nostrils 4 (four) times daily. 08/31/23  Yes Becky Augusta, NP  metFORMIN (GLUCOPHAGE) 500 MG tablet Take by mouth. 06/30/23 06/29/24 Yes [provider]  promethazine-dextromethorphan (PROMETHAZINE-DM) 6.25-15 MG/5ML syrup Take 5 mLs by mouth 4 (four) times daily as needed. 08/31/23  Yes Becky Augusta, NP  sertraline (ZOLOFT) 100 MG tablet Take 100 mg by mouth daily.   Yes [provider]    Family History History reviewed. No pertinent family history.  Social History Social History   Tobacco Use   Smoking  status: Never    Passive exposure: Never  Vaping Use   Vaping status: Never Used  Substance Use Topics   Alcohol use: No     Allergies   Patient has no known allergies.   Review of Systems Review of Systems  Constitutional:  Positive for chills and fever.  HENT:  Positive for congestion and rhinorrhea. Negative for ear pain and sore throat.   Respiratory:  Positive for cough. Negative for shortness of breath and wheezing.   Musculoskeletal:  Positive for arthralgias and myalgias.     Physical Exam Triage Vital Signs ED Triage Vitals  Encounter Vitals Group     BP      Systolic BP Percentile      Diastolic BP Percentile      Pulse      Resp      Temp      Temp src      SpO2      Weight      Height      Head Circumference      Peak Flow      Pain Score      Pain Loc      Pain Education      Exclude from Growth Chart    No data found.  Updated Vital Signs BP 105/71 (BP Location: Right Arm)   Pulse 92   Temp 99.5 F (37.5 C) (Oral)   Resp 19   SpO2 95%   Visual  Acuity Right Eye Distance:   Left Eye Distance:   Bilateral Distance:    Right Eye Near:   Left Eye Near:    Bilateral Near:     Physical Exam Vitals and nursing note reviewed.  Constitutional:      Appearance: Normal appearance. She is not ill-appearing.  HENT:     Head: Normocephalic and atraumatic.     Right Ear: Tympanic membrane, ear canal and external ear normal. There is no impacted cerumen.     Left Ear: Tympanic membrane, ear canal and external ear normal. There is no impacted cerumen.     Nose: Congestion and rhinorrhea present.     Comments: Mucosa is erythematous and edematous with clear discharge in both nares.    Mouth/Throat:     Mouth: Mucous membranes are moist.     Pharynx: Oropharynx is clear. No oropharyngeal exudate or posterior oropharyngeal erythema.  Cardiovascular:     Rate and Rhythm: Normal rate and regular rhythm.     Pulses: Normal pulses.     Heart sounds:  Normal heart sounds. No murmur heard.    No friction rub. No gallop.  Pulmonary:     Effort: Pulmonary effort is normal.     Breath sounds: Normal breath sounds. No wheezing, rhonchi or rales.  Musculoskeletal:     Cervical back: Normal range of motion and neck supple. No tenderness.  Lymphadenopathy:     Cervical: No cervical adenopathy.  Skin:    General: Skin is warm and dry.     Capillary Refill: Capillary refill takes less than 2 seconds.     Findings: No rash.  Neurological:     General: No focal deficit present.     Mental Status: She is alert and oriented to person, place, and time.      UC Treatments / Results  Labs (all labs ordered are listed, but only abnormal results are displayed) Labs Reviewed  RESP PANEL BY RT-PCR (FLU A&B, COVID) ARPGX2 - Abnormal; Notable for the following components:      Result Value   SARS Coronavirus 2 by RT PCR POSITIVE (*)    All other components within normal limits    EKG   Radiology No results found.  Procedures Procedures (including critical care time)  Medications Ordered in UC Medications - No data to display  Initial Impression / Assessment and Plan / UC Course  I have reviewed the triage vital signs and the nursing notes.  Pertinent labs & imaging results that were available during my care of the patient were reviewed by me and considered in my medical decision making (see chart for details).   Patient is a nontoxic-appearing 36 old female presenting for evaluation of 1 day worth of flu/COVID-like symptoms as outlined HPI above.  On exam she does have inflamed nasal mucosa with clear rhinorrhea but the remainder of her upper tract is benign.  Cardiopulmonary exam reveals clear lung sounds in all fields.  Given her cluster of symptoms I feel like the patient was likely has a respiratory virus.  I will order a COVID and flu PCR.  Respiratory panel is COVID-positive.  Will discharge home with diagnosis of COVID-19.   After nasal spray, Tessalon Perles, (give cough syrup for cough and congestion.  Tylenol and/or ibuprofen as needed for fever or bodyaches.  Return and ER precautions reviewed.   Final Clinical Impressions(s) / UC Diagnoses   Final diagnoses:  COVID-19     Discharge Instructions  CDC guidelines state that you must wear a mask for the first 5 days of symptoms when you are around other people.  After 5 days you no longer need to mask as you are no longer considered infectious.  There is no longer need to quarantine unless you have a fever.  If you do have a fever then you need to quarantine until you have been fever free for 24 hours without taking Tylenol and/or ibuprofen.  Use over-the-counter Tylenol and/or ibuprofen according to the package instructions as needed for fever and pain.  Use the Atrovent nasal spray, 2 squirts up each nostril every 6 hours, as needed for nasal congestion and runny nose.  Use the Tessalon Perles every 8 hours during the day as needed for cough.  Take them with a small sip of water.  You may experience numbness to the base of your tongue or metallic taste in her mouth, this is normal.  Use the Promethazine DM cough syrup at bedtime as needed for cough and congestion.  Be mindful this medication will make you sleepy.  If you develop any worsening respiratory symptoms such as shortness of breath, shortness of breath at rest, feel as though you cannot catch your breath, you are unable to speak in full sentences, or, as a late sign, your lips begin turning blue you need to call 911 and go to the ER for evaluation.      ED Prescriptions     Medication Sig Dispense Auth. Provider   benzonatate (TESSALON) 100 MG capsule Take 2 capsules (200 mg total) by mouth every 8 (eight) hours. 21 capsule Becky Augusta, NP   ipratropium (ATROVENT) 0.06 % nasal spray Place 2 sprays into both nostrils 4 (four) times daily. 15 mL Becky Augusta, NP    promethazine-dextromethorphan (PROMETHAZINE-DM) 6.25-15 MG/5ML syrup Take 5 mLs by mouth 4 (four) times daily as needed. 118 mL Becky Augusta, NP      PDMP not reviewed this encounter.   Becky Augusta, NP 08/31/23 1029

## 2024-02-27 ENCOUNTER — Ambulatory Visit
Admission: EM | Admit: 2024-02-27 | Discharge: 2024-02-27 | Disposition: A | Discharge status: Home / Self Care | Attending: Physician Assistant | Admitting: Physician Assistant

## 2024-02-27 ENCOUNTER — Encounter: Payer: Self-pay | Admitting: Emergency Medicine

## 2024-02-27 DIAGNOSIS — R051 Acute cough: Secondary | ICD-10-CM

## 2024-02-27 DIAGNOSIS — J22 Unspecified acute lower respiratory infection: Secondary | ICD-10-CM | POA: Diagnosis present

## 2024-02-27 DIAGNOSIS — R509 Fever, unspecified: Secondary | ICD-10-CM

## 2024-02-27 LAB — GROUP A STREP BY PCR: Group A Strep by PCR: NOT DETECTED

## 2024-02-27 LAB — SARS CORONAVIRUS 2 BY RT PCR: SARS Coronavirus 2 by RT PCR: NEGATIVE

## 2024-02-27 MED ORDER — PROMETHAZINE-DM 6.25-15 MG/5ML PO SYRP: 5.0000 mL | ORAL_SOLUTION | Freq: Four times a day (QID) | ORAL | 0 refills | Status: AC | PRN

## 2024-02-27 MED ORDER — ACETAMINOPHEN 325 MG PO TABS
975.0000 mg | ORAL_TABLET | Freq: Once | ORAL | Status: AC
  Administered 2024-02-27: 975 mg via ORAL

## 2024-02-27 MED ORDER — DOXYCYCLINE HYCLATE 100 MG PO CAPS: 100.0000 mg | ORAL_CAPSULE | Freq: Two times a day (BID) | ORAL | 0 refills | Status: AC

## 2024-02-27 NOTE — ED Provider Notes (Signed)
 [facility]    CSN: [id number] Arrival date & time: 02/27/24  1435      History   Chief Complaint Chief Complaint  Patient presents with   Sore Throat   Cough    HPI Christy Stone is a 58 y.o. female presenting for cough and congestion that began 2 days ago.  Patient reports being sick about a week ago with sore throat, cough, and congestion.  Symptoms have not fully gone away until they returned recently.  Patient reports developing a fever up to 100.4 degrees.  Denies ear pain, sinus pain, chest pain, shortness of breath, abdominal pain, vomiting or diarrhea.  Taking OTC meds.  HPI  Past Medical History:  Diagnosis Date   Anxiety    Diabetes mellitus without complication (HCC)     There are no active problems to display for this patient.   History reviewed. No pertinent surgical history.  OB History   No obstetric history on file.      Home Medications    Prior to Admission medications   Medication Sig Start Date End Date Taking? Authorizing Provider  doxycycline (VIBRAMYCIN) 100 MG capsule Take 1 capsule (100 mg total) by mouth 2 (two) times daily for 7 days. 02/27/24 03/05/24 Yes Nancy Axon B, PA-C  promethazine -dextromethorphan (PROMETHAZINE -DM) 6.25-15 MG/5ML syrup Take 5 mLs by mouth 4 (four) times daily as needed. 02/27/24  Yes Floydene Hy, PA-C  ALPRAZolam (XANAX) 0.5 MG tablet Take 0.5 mg by mouth at bedtime as needed for anxiety.    [provider]  ipratropium (ATROVENT ) 0.06 % nasal spray Place 2 sprays into both nostrils 4 (four) times daily. 08/31/23   Kent Pear, NP  metFORMIN (GLUCOPHAGE) 500 MG tablet Take by mouth. 06/30/23 06/29/24  [provider]  sertraline (ZOLOFT) 100 MG tablet Take 100 mg by mouth daily.    [provider]    Family History History reviewed. No pertinent family history.  Social History Social History   Tobacco Use   Smoking status: Never    Passive exposure: Never   Vaping Use   Vaping status: Never Used  Substance Use Topics   Alcohol use: No     Allergies   Patient has no known allergies.   Review of Systems Review of Systems  Constitutional:  Positive for fatigue and fever. Negative for chills and diaphoresis.  HENT:  Positive for congestion and rhinorrhea. Negative for ear pain, sinus pressure, sinus pain and sore throat.   Respiratory:  Positive for cough. Negative for shortness of breath and wheezing.   Cardiovascular:  Negative for chest pain.  Gastrointestinal:  Negative for abdominal pain, nausea and vomiting.  Musculoskeletal:  Negative for myalgias.  Skin:  Negative for rash.  Neurological:  Negative for weakness and headaches.  Hematological:  Negative for adenopathy.     Physical Exam Triage Vital Signs ED Triage Vitals  Encounter Vitals Group     BP 02/27/24 1509 126/72     Systolic BP Percentile --      Diastolic BP Percentile --      Pulse Rate 02/27/24 1509 95     Resp 02/27/24 1509 16     Temp 02/27/24 1509 (!) 100.4 F (38 C)     Temp Source 02/27/24 1509 Oral     SpO2 02/27/24 1509 95 %     Weight 02/27/24 1507 110 lb 0.2 oz (49.9 kg)     Height 02/27/24 1507 4\' 9"  (1.448 m)  Head Circumference --      Peak Flow --      Pain Score 02/27/24 1507 3     Pain Loc --      Pain Education --      Exclude from Growth Chart --    No data found.  Updated Vital Signs BP 126/72 (BP Location: Left Arm)   Pulse 95   Temp (!) 100.4 F (38 C) (Oral)   Resp 16   Ht 4\' 9"  (1.448 m)   Wt 110 lb 0.2 oz (49.9 kg)   SpO2 95%   BMI 23.81 kg/m       Physical Exam Vitals and nursing note reviewed.  Constitutional:      General: She is not in acute distress.    Appearance: Normal appearance. She is not ill-appearing or toxic-appearing.  HENT:     Head: Normocephalic and atraumatic.     Nose: Congestion present.     Mouth/Throat:     Mouth: Mucous membranes are moist.     Pharynx: Oropharynx is clear.  Posterior oropharyngeal erythema present.  Eyes:     General: No scleral icterus.       Right eye: No discharge.        Left eye: No discharge.     Conjunctiva/sclera: Conjunctivae normal.  Cardiovascular:     Rate and Rhythm: Normal rate and regular rhythm.     Heart sounds: Normal heart sounds.  Pulmonary:     Effort: Pulmonary effort is normal. No respiratory distress.     Breath sounds: Normal breath sounds.  Musculoskeletal:     Cervical back: Neck supple.  Skin:    General: Skin is dry.  Neurological:     General: No focal deficit present.     Mental Status: She is alert. Mental status is at baseline.     Motor: No weakness.     Gait: Gait normal.  Psychiatric:        Mood and Affect: Mood normal.        Behavior: Behavior normal.      UC Treatments / Results  Labs (all labs ordered are listed, but only abnormal results are displayed) Labs Reviewed  GROUP A STREP BY PCR  SARS CORONAVIRUS 2 BY RT PCR    EKG   Radiology No results found.  Procedures Procedures (including critical care time)  Medications Ordered in UC Medications  acetaminophen (TYLENOL) tablet 975 mg (975 mg Oral Given 02/27/24 1512)    Initial Impression / Assessment and Plan / UC Course  I have reviewed the triage vital signs and the nursing notes.  Pertinent labs & imaging results that were available during my care of the patient were reviewed by me and considered in my medical decision making (see chart for details).   58 y/o female presents for fever, cough and congestion x 2 days. States she had URI symptoms including sore throat last week that had resolved.   Current temperature 100.4 degrees.  Other vitals normal and stable.  Mild ill-appearing but nontoxic.  On exam has nasal congestion and erythema posterior pharynx.  Chest clear.  Heart regular rate and rhythm.  Negative COVID and strep test today.  Reviewed results with patient.  Suspect developing a bacterial lower  respiratory tract infection.  Will treat this time with doxycycline.  Also sent Promethazine  DM.  Encouraged increasing rest and fluids.  Reviewed returning if fever is not breaking, cough worsens or she has trouble breathing.  Final Clinical  Impressions(s) / UC Diagnoses   Final diagnoses:  Lower resp. tract infection  Fever, unspecified  Acute cough     Discharge Instructions      - Return for reevaluation, with likely chest x-ray, if not breaking fever in a couple of days, cough worsens or you have trouble breathing.   ED Prescriptions     Medication Sig Dispense Auth. Provider   promethazine -dextromethorphan (PROMETHAZINE -DM) 6.25-15 MG/5ML syrup Take 5 mLs by mouth 4 (four) times daily as needed. 118 mL Nancy Axon B, PA-C   doxycycline (VIBRAMYCIN) 100 MG capsule Take 1 capsule (100 mg total) by mouth 2 (two) times daily for 7 days. 14 capsule Floydene Hy, PA-C      PDMP not reviewed this encounter.   Floydene Hy, PA-C 02/27/24 1550

## 2024-02-27 NOTE — Discharge Instructions (Signed)
-   Return for reevaluation, with likely chest x-ray, if not breaking fever in a couple of days, cough worsens or you have trouble breathing.

## 2024-02-27 NOTE — ED Triage Notes (Signed)
 Patient c/o cough, congestion, sore throat and fever that started on Friday.

## 2024-04-12 NOTE — Progress Notes (Signed)
 [facility] at [facility] Note: Christy Stone, [place] [address]. Phone [phone]  04/12/2024  Patient Name:   Christy Stone  MRN: [id number]  Demographics:    Age-  58 y.o.     Date of Birth-  1966/04/08  Chief complaint (CC):   Chief Complaint  Patient presents with  . Medication Problem    Patient states her psychiatrist left at [facility] Dr. Daniel. Is not refilling her meds because provider left.    Assessment/Plan:  Pleasant 58 y.o. female with:  Assessment & Plan Generalized Anxiety Disorder Anxiety and insomnia managed with Alprazolam and Trazodone. Referred to [facility] for ongoing care. - Refill Alprazolam 0.5 mg oral BID PRN.# 60 NR - Refill Trazodone 50 mg oral nightly.# [id number] - Refer to [facility] for psychiatric care. - Provide contact information for [facility] and assist with the referral process. -Ambulatory referral to Psychiatry  General Health Maintenance Due for second shingles vaccine dose in September. - Administer the second dose of the shingles vaccine in September.   Diagnosis ICD-10-CM Associated Orders  1. Generalized anxiety disorder  F41.1 ALPRAZolam (XANAX) 0.5 MG tablet    Ambulatory referral to Psychiatry    2. Primary insomnia  F51.01 traZODone (DESYREL) 50 MG tablet      I personally spent 25 minutes face-to-face and non-face-to-face in the care of this patient, which includes all pre, intra, and post visit time on the date of service.  All documented time was specific to the E/M visit and does not include any procedures that may have been performed. We discussed medical, dietary, lifestyle, and health maintenance modifications to optimize health. Standard precautions followed during visit. Medication adherence and barriers to the treatment plan have been addressed.  Patient voiced understanding,  needs F/U visit prn.   Health Maintenance:  Health Maintenance Due  Topic Date Due  . Retinal Eye Exam  Never done  . Urine Albumin/Creatinine Ratio  Never done  . Zoster Vaccines (1 of 2) Never done  . COVID-19 Vaccine (5 - 2024-25 season) 06/06/2023  . Foot Exam  04/06/2024    Subjective:  History of present illness (HPI):    History of Present Illness Christy Stone is a 58 year old female who presents for medication refills for anxiety and insomnia.  She is experiencing difficulty obtaining refills for her anxiety and insomnia medications after her psychiatrist left unexpectedly. She has been unable to find a new provider as many are not accepting new patients, which has caused her significant concern about the continuity of her medication management.  She is currently taking Xanax 0.5 mg, one tablet by mouth twice a day for anxiety, and trazodone 50 mg, one to two tablets by mouth at bedtime for insomnia.  Denies HA, fever, chest pain, shortness of breath, N/V/D, bowel or bladder issues, vision or hearing changes, or swelling.   Relevant ROS: Reviewed 12 systems, positive findings listed, all others negative.  Pertinent Past Med Hx:  Past Medical History[1]  Medications:   Current Medications[2]   Allergies:  Allergies[3]  Pertinent Social Hx and Habits: EMR reviewed  Pertinent Family Hx: EMR reviewed  Objective:    BP Readings from Last 3 Encounters:  04/12/24 104/64  03/27/24 108/77  12/07/23 110/70     Vitals:   04/12/24 1308  BP: 104/64  BP Site: L Arm  BP Position: Sitting  BP Cuff Size: Medium  Pulse:  90  Temp: 36.1 C (97 F)  TempSrc: Temporal  SpO2: 97%  Weight: 45.8 kg (101 lb)     Physical Exam:  Physical Exam   General Appearance: WDWN in NAD     Skin: W, D, I HEENT: PERRLA, EOMI, TM's clear Respiratory: Clear throughout Cardio: RRR Abdomen: Soft and non-tnder Neurologic: A & O X 4, Grossly intact, stable gait PSYCH:  Behavior calm and cooperative  Diagnostics:   Results DIAGNOSTIC Colonoscopy: Two polyps excised  Lab Results  Component Value Date   WBC 4.9 08/09/2023   HGB 13.0 08/09/2023   HCT 37.6 08/09/2023   PLT 303 08/09/2023    Lab Results  Component Value Date   NA 139 12/07/2023   K 4.2 12/07/2023   CL 98 12/07/2023   CO2 27.0 12/07/2023   BUN 5 (L) 12/07/2023   CREATININE 0.51 (L) 12/07/2023   GLU 147 (H) 12/07/2023   CALCIUM 9.8 12/07/2023    Lab Results  Component Value Date   BILITOT 0.3 12/07/2023   PROT 7.5 12/07/2023   ALBUMIN 4.4 12/07/2023   ALT 37 12/07/2023   AST 32 12/07/2023   ALKPHOS 136 (H) 12/07/2023    No results found for: PT, INR, APTT   TSH  Date Value Ref Range Status  08/25/2022 1.171 0.550 - 4.780 uIU/mL Final      Slater MARLA Sole, MD        [1] Past Medical History: Diagnosis Date  . Anxiety   [2]  Current Outpatient Medications:  .  ALPRAZolam (XANAX) 0.5 MG tablet, Take 1 tablet (0.5 mg total) by mouth two (2) times a day as needed. (Patient taking differently: Take 1 tablet (0.5 mg total) by mouth nightly as needed. Every night), Disp: , Rfl:  .  blood sugar diagnostic (ACCU-CHEK GUIDE TEST STRIPS) Strp, by Other route daily before breakfast., Disp: 100 each, Rfl: 3 .  blood-glucose meter kit, Use as instructed, Disp: 1 each, Rfl: 0 .  lancets (ACCU-CHEK SOFTCLIX LANCETS) Misc, USE 1  TO CHECK GLUCOSE ONCE DAILY, Disp: 100 each, Rfl: 2 .  lancing device Misc, 1 each by Miscellaneous route daily before breakfast., Disp: 1 each, Rfl: 1 .  metFORMIN (GLUCOPHAGE) 500 MG tablet, Take 1 tablet (500 mg total) by mouth in the morning and 1 tablet (500 mg total) in the evening. Take with meals., Disp: 180 tablet, Rfl: 1 .  traZODone (DESYREL) 50 MG tablet, Take 1 tablet (50 mg total) by mouth nightly., Disp: , Rfl:  [3] No Known Allergies

## 2024-06-07 ENCOUNTER — Other Ambulatory Visit
Admission: RE | Admit: 2024-06-07 | Discharge: 2024-06-07 | Disposition: A | Discharge status: Home / Self Care | Source: Ambulatory Visit | Attending: Psychiatry | Admitting: Psychiatry

## 2024-06-07 ENCOUNTER — Ambulatory Visit (INDEPENDENT_AMBULATORY_CARE_PROVIDER_SITE_OTHER): Payer: Self-pay | Admitting: Psychiatry

## 2024-06-07 ENCOUNTER — Encounter: Payer: Self-pay | Admitting: Psychiatry

## 2024-06-07 VITALS — BP 118/68 | HR 86 | Temp 98.5°F | Ht <= 58 in | Wt 102.0 lb

## 2024-06-07 DIAGNOSIS — F411 Generalized anxiety disorder: Secondary | ICD-10-CM | POA: Insufficient documentation

## 2024-06-07 DIAGNOSIS — Z79899 Other long term (current) drug therapy: Secondary | ICD-10-CM | POA: Diagnosis present

## 2024-06-07 DIAGNOSIS — F32A Depression, unspecified: Secondary | ICD-10-CM | POA: Insufficient documentation

## 2024-06-07 DIAGNOSIS — F331 Major depressive disorder, recurrent, moderate: Secondary | ICD-10-CM

## 2024-06-07 LAB — TSH: TSH: 1.233 u[IU]/mL (ref 0.350–4.500)

## 2024-06-07 MED ORDER — ESCITALOPRAM OXALATE 5 MG PO TABS: 5.0000 mg | ORAL_TABLET | Freq: Every day | ORAL | 1 refills | Status: DC

## 2024-06-07 MED ORDER — ALPRAZOLAM 0.25 MG PO TABS: 0.2500 mg | ORAL_TABLET | Freq: Two times a day (BID) | ORAL | Status: DC | PRN

## 2024-06-07 MED ORDER — ALPRAZOLAM 0.5 MG PO TABS: 0.7500 mg | ORAL_TABLET | Freq: Every day | ORAL | Status: DC

## 2024-06-07 NOTE — Progress Notes (Unsigned)
 Psychiatric Initial Adult Assessment   Patient Identification: Christy Stone MRN:  [id number] Date of Evaluation:  06/07/2024 Referral Source: Slater Sole MD Chief Complaint:   Chief Complaint  Patient presents with   Establish Care   Anxiety   Depression   Medication Refill   Visit Diagnosis:    ICD-10-CM   1. GAD (generalized anxiety disorder)  F41.1 ALPRAZolam  (XANAX ) 0.5 MG tablet    ALPRAZolam  (XANAX ) 0.25 MG tablet    escitalopram  (LEXAPRO ) 5 MG tablet    TSH    2. MDD (major depressive disorder), recurrent episode, moderate (HCC)  F33.1 ALPRAZolam  (XANAX ) 0.25 MG tablet    escitalopram  (LEXAPRO ) 5 MG tablet    3. High risk medication use  Z79.899 Urine drugs of abuse scrn w alc, routine (Ref Lab)    TSH      Discussed the use of AI scribe software for clinical note transcription with the patient, who gave verbal consent to proceed.  History of Present Illness Christy Stone is a 58 year old Caucasian female, employed, lives in [place] has a history of major depression, generalized anxiety disorder, diabetes mellitus was evaluated in office today, presented to establish care.  Over a prolonged period, she has experienced significant anxiety, which she reports relates to her divorce and subsequent life stressors. Persistent worry, feeling nervous about everything, and engaging in excessive 'what if' thinking characterize her experience. She describes worrying to the extreme, having trouble relaxing, and perceiving a worsening of her anxiety over the past few years. Crowds cause her discomfort, and she prefers being with 1 or 2 people. For anxiety, she currently takes alprazolam  0.5 mg twice daily and states she has been taking it every day.  She may have tried Zoloft in the past however she does not remember her response or if she had any adverse side effects to it.  She does not remember trying any other medications for her anxiety symptoms.  Longstanding depressive  symptoms include persistent sadness, low motivation, and feelings of inadequacy both at work and at home. 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 Over the past 2 weeks, she rates her depression and anxiety symptoms as high. She reports overeating, particularly snacking before bed, but maintains her ability to concentrate at work.  She denies any history of manic symptoms.  She continues to have chronic sleep difficulties, and she uses trazodone nightly, which she states helps her sleep.  She denies any current or past thoughts of hurting herself or others and denies any history of psychosis, including auditory or visual hallucinations. She also denies any obsessive or compulsive behaviors, skin picking, or hair pulling.     Associated Signs/Symptoms: Depression Symptoms:  depressed mood, insomnia, anxiety, (Hypo) Manic Symptoms:  Denies Anxiety Symptoms:  Excessive Worry, Social Anxiety, Psychotic Symptoms:  Denies PTSD Symptoms: Negative  Past Psychiatric History: She was hospitalized at [facility] for depression and feeling highly upset following separation from her husband, approximately around the age 65. She previously tried sertraline (Zoloft) for depression but cannot recall the reason for discontinuation. She denies any history of past suicide attempts, self-harm behaviors, or trauma. She reports never having received counseling or therapy in the past.  She was previously under the care of [facility] in [place].  Most recently her medications were being prescribed by primary care provider.  I have reviewed notes per Dr.Su as well as PCP,  Dr.Ybanez ( 04/12/2024) patient with diagnosis of generalized anxiety disorder, major depression.  Patient on Xanax  0.5 mg twice a day for anxiety, trazodone 50 mg 1 to 2 tablets at bedtime for sleep.  Previous  Psychotropic Medications: Yes Zoloft, Xanax   Substance Abuse History in the last 12 months:  No.She explicitly denies any history of cannabis, cocaine, heroin, alcohol, or other drug use. She denies any history of legal problems related to substance use.  Consequences of Substance Abuse: Negative  Past Medical History: Denies any history of seizures Past Medical History:  Diagnosis Date   Anxiety    Diabetes mellitus without complication (HCC)    Diabetes mellitus, type II (HCC)     Past Surgical History:  Procedure Laterality Date   TUBAL LIGATION      Family Psychiatric History: Denies any history of mental health problems in her family  Family History:  Family History  Problem Relation Age of Onset   Mental illness Neg Hx     Social History:   Social History   Socioeconomic History   Marital status: Divorced    Spouse name: Not on file   Number of children: 3   Years of education: Not on file   Highest education level: Bachelor's degree (e.g., BA, AB, BS)  Occupational History   Not on file  Tobacco Use   Smoking status: Never    Passive exposure: Never   Smokeless tobacco: Not on file  Vaping Use   Vaping status: Never Used  Substance and Sexual Activity   Alcohol use: Yes    Alcohol/week: 1.0 standard drink of alcohol    Types: 1 Glasses of wine per week    Comment: maybe 1 or 2 on weekend   Drug use: Never   Sexual activity: Not Currently  Other Topics Concern   Not on file  Social History Narrative   Not on file   Social Drivers of Health   Financial Resource Strain: Low Risk  (08/25/2022)   Received from [facility]   Overall Financial Resource Strain (CARDIA)    Difficulty of Paying Living Expenses: Not hard at all  Food Insecurity: No Food Insecurity (12/27/2023)   Received from [facility]   Hunger Vital Sign    Within the past 12 months, you worried that your food would run out before you got the money to buy more.: Never true     Within the past 12 months, the food you bought just didn't last and you didn't have money to get more.: Never true  Transportation Needs: No Transportation Needs (12/27/2023)   Received from [facility]   PRAPARE - Transportation    Lack of Transportation (Medical): No    Lack of Transportation (Non-Medical): No  Physical Activity: Not on file  Stress: Not on file  Social Connections: Not on file    Additional Social History: She was born in [place], [place] .  She was raised by both parents until the age of 61 thereafter they got divorced.  She moved around a lot throughout [place]  with her parents after that.  She has 1 biological brother.  She earned a BA in [place]. She is currently employed as a Building surveyor and previously worked in [employer] at [employer] and [employer]. She has been divorced for over 10 years. She has 3 children (ages 51, 36, and 1) and lives with her son and daughter (ages 39 and 10), both of whom are employed. She identifies as  religious and believes in God. She reports no history of legal problems or military service. Reading is an activity she enjoys regularly.  She currently lives in [place].  Allergies:  No Known Allergies  Metabolic Disorder Labs: No results found for: HGBA1C, MPG No results found for: PROLACTIN No results found for: CHOL, TRIG, HDL, CHOLHDL, VLDL, LDLCALC Lab Results  Component Value Date   TSH 1.233 06/07/2024    Therapeutic Level Labs: No results found for: LITHIUM No results found for: CBMZ No results found for: VALPROATE  Current Medications: Current Outpatient Medications  Medication Sig Dispense Refill   [START ON 06/22/2024] ALPRAZolam  (XANAX ) 0.25 MG tablet Take 1 tablet (0.25 mg total) by mouth 2 (two) times daily as needed for up to 15 days for anxiety.     ALPRAZolam  (XANAX ) 0.5 MG tablet Take 1.5 tablets (0.75 mg total) by mouth daily for 15 days. Take half tablet daily morning  and 1 tablet daily at bedtime     escitalopram  (LEXAPRO ) 5 MG tablet Take 1 tablet (5 mg total) by mouth daily with breakfast. 30 tablet 1   Lancet Devices (SIMPLE DIAGNOSTICS LANCING DEV) MISC 1 each by Other route.     metFORMIN (GLUCOPHAGE) 500 MG tablet Take by mouth.     promethazine -dextromethorphan (PROMETHAZINE -DM) 6.25-15 MG/5ML syrup Take 5 mLs by mouth 4 (four) times daily as needed. 118 mL 0   traZODone (DESYREL) 50 MG tablet Take 50 mg by mouth. (Patient taking differently: Take 50-100 mg by mouth.)     ACCU-CHEK GUIDE TEST test strip every morning.     Accu-Chek Softclix Lancets lancets SMARTSIG:1 Topical Daily     No current facility-administered medications for this visit.    Musculoskeletal: Strength & Muscle Tone: within normal limits Gait & Station: normal Patient leans: N/A  Psychiatric Specialty Exam: Review of Systems  Psychiatric/Behavioral:  Positive for dysphoric mood. The patient is nervous/anxious.     Blood pressure 118/68, pulse 86, temperature 98.5 F (36.9 C), temperature source Temporal, height 4' 9 (1.448 m), weight 102 lb (46.3 kg), SpO2 96%.Body mass index is 22.07 kg/m.  General Appearance: Casual  Eye Contact:  Fair  Speech:  Normal Rate  Volume:  Normal  Mood:  Anxious and Depressed  Affect:  Congruent  Thought Process:  Goal Directed and Descriptions of Associations: Intact  Orientation:  Full (Time, Place, and Person)  Thought Content:  Logical  Suicidal Thoughts:  No  Homicidal Thoughts:  No  Memory:  Immediate;   Fair Recent;   Fair Remote;   Fair  Judgement:  Fair  Insight:  Fair  Psychomotor Activity:  Normal  Concentration:  Concentration: Fair and Attention Span: Fair  Recall:  [employer] of Knowledge:Fair  Language: Fair  Akathisia:  No  Handed:  Right  AIMS (if indicated):  not done  Assets:  Communication Skills Desire for Improvement Housing Social Support Transportation  ADL's:  Intact  Cognition: WNL  Sleep:   Varies   Screenings: GAD-7    Garment/textile technologist Visit from 06/07/2024 in [facility]  Total GAD-7 Score 10   PHQ2-9    Flowsheet Row Office Visit from 06/07/2024 in [facility]  PHQ-2 Total Score 4  PHQ-9 Total Score 12   Flowsheet Row Office Visit from 06/07/2024 in [facility] UC from 02/27/2024 in [facility]  UC from 08/31/2023 in [facility] [place]  C-SSRS RISK CATEGORY No Risk No Risk No Risk    Assessment and Plan: Christy Stone is a 58 year old Caucasian female with history of depression, anxiety, diabetes mellitus was evaluated in office today.  Discussed assessment and plan as noted below.   Assessment & Plan Generalized anxiety disorder -unstable Long-term use of alprazolam  (0.5 mg twice daily) for anxiety which is currently not managing her anxiety.  She experiences high levels of anxiety, particularly in social situations, and has been using alprazolam  daily for several years. Tapering off alprazolam  is recommended, transitioning to a more appropriate long-term treatment for anxiety. - Order urine drug screen before prescribing new medications. - Taper Alprazolam : Reduce to 0.25 mg in the morning and 0.5 mg at night for one week, then 0.25 mg twice daily starting September 18. - Prescribe Lexapro  (escitalopram ) 5 mg daily for anxiety and depression. - Refer to therapist Ms. Ellouise Hummer for counseling and therapy-provided contact information.. - Reviewed [place] PMP AWARxE   MDD-unstable Symptoms rated as high in the past two weeks, with persistent sadness, feelings of inadequacy, and lack of motivation. Previously used sertraline (Zoloft) but does not recall the reason for discontinuation. Initiating treatment with Lexapro .Informed about potential side effects of Lexapro , including nausea and diarrhea, and advised to  take with food. Emphasized the importance of taking Lexapro  daily for 6-8 weeks to achieve full benefit. - Prescribe Lexapro  (escitalopram ) 5 mg daily. - Educate on potential side effects of Lexapro . - Monitor for adverse effects and adjust dosage as needed. - Continue Trazodone 50 - 100 mg at night for sleep.  High risk medication use-will order a urine drug screen, TSH.  Patient to go to [facility] lab.  Once urine drug screen is reviewed will go ahead and sent prescription with reduced dosage for alprazolam  as discussed to her pharmacy of choice.  Patient aware of clinic policy regarding controlled substance prescription.  Follow-up Follow-up in clinic in 3 weeks or sooner in person.   Collaboration of Care: Other I have reviewed notes per previous psychiatrist Dr.Su as well as primary care provider Dr.Jane Alyse as noted above.  Patient encouraged to establish care with therapist Ms. Ellouise Hummer, provided resources.  Patient/Guardian was advised Release of Information must be obtained prior to any record release in order to collaborate their care with an outside provider. Patient/Guardian was advised if they have not already done so to contact the registration department to sign all necessary forms in order for us  to release information regarding their care.   Consent: Patient/Guardian gives verbal consent for treatment and assignment of benefits for services provided during this visit. Patient/Guardian expressed understanding and agreed to proceed.  This note was generated in part or whole with voice recognition software. Voice recognition is usually quite accurate but there are transcription errors that can and very often do occur. I apologize for any typographical errors that were not detected and corrected.    Millissa Deese, MD 9/4/20255:15 PM

## 2024-06-07 NOTE — Patient Instructions (Addendum)
 Christy Stone--tel:[phone]- call for therapy   Escitalopram  Tablets What is this medication? ESCITALOPRAM  (es sye TAL oh pram) treats depression and anxiety. It increases the amount of serotonin in the brain, a hormone that helps regulate mood. It belongs to a group of medications called SSRIs. This medicine may be used for other purposes; ask your health care provider or pharmacist if you have questions. COMMON BRAND NAME(S): Lexapro  What should I tell my care team before I take this medication? They need to know if you have any of these conditions: Bipolar disorder or a family history of bipolar disorder Diabetes Glaucoma Heart disease Kidney disease Liver disease Receiving electroconvulsive therapy Seizures Suicidal thoughts, plans, or attempt by you or a family member An unusual or allergic reaction to escitalopram , other medications, foods, dyes, or preservatives Pregnant or trying to become pregnant Breastfeeding How should I use this medication? Take this medication by mouth with a glass of water. Take it as directed on the prescription label at the same time every day. You can take it with or without food. If it upsets your stomach, take it with food. Do not take it more often than directed. Do not stop taking this medication suddenly except upon the advice of your care team. Stopping this medication too quickly may cause serious side effects or your condition may worsen. A special MedGuide will be given to you by the pharmacist with each prescription and refill. Be sure to read this information carefully each time. Talk to your care team about the use of this medication in children. Special care may be needed. Overdosage: If you think you have taken too much of this medicine contact a poison control center or emergency room at once. NOTE: This medicine is only for you. Do not share this medicine with others. What if I miss a dose? If you miss a dose, take it as soon as  you can. If it is almost time for your next dose, take only that dose. Do not take double or extra doses. What may interact with this medication? Do not take this medication with any of the following: Certain medications for fungal infections, such as fluconazole, itraconazole, ketoconazole, posaconazole, voriconazole Cisapride Citalopram Dronedarone Linezolid MAOIs, such as Carbex, Eldepryl, Marplan, Nardil, and Parnate Methylene blue (injected into a vein) Pimozide Thioridazine This medication may also interact with the following: Alcohol Amphetamines Aspirin and aspirin-like medications Carbamazepine Certain medications for mental health conditions Certain medications for migraine headache, such as almotriptan, eletriptan, frovatriptan, naratriptan, rizatriptan, sumatriptan, zolmitriptan Certain medications for sleep Certain medications that treat or prevent blood clots, such as warfarin, enoxaparin, dalteparin Cimetidine Diuretics Dofetilide Fentanyl Furazolidone Isoniazid Lithium Metoprolol NSAIDs, medications for pain and inflammation, such as ibuprofen or naproxen Other medications that cause heart rhythm changes Procarbazine Rasagiline Supplements, such as St. John's wort, kava kava, valerian Tramadol  Tryptophan Ziprasidone This list may not describe all possible interactions. Give your health care provider a list of all the medicines, herbs, non-prescription drugs, or dietary supplements you use. Also tell them if you smoke, drink alcohol, or use illegal drugs. Some items may interact with your medicine. What should I watch for while using this medication? Tell your care team if your symptoms do not get better or if they get worse. Visit your care team for regular checks on your progress. Because it may take several weeks to see the full effects of this medication, it is important to continue your treatment as prescribed by your care team. Watch for new  or worsening  thoughts of suicide or depression. This includes sudden changes in mood, behaviors, or thoughts. These changes can happen at any time but are more common in the beginning of treatment or after a change in dose. Call your care team right away if you experience these thoughts or worsening depression. This medication may cause mood and behavior changes, such as anxiety, nervousness, irritability, hostility, restlessness, excitability, hyperactivity, or trouble sleeping. These changes can happen at any time but are more common in the beginning of treatment or after a change in dose. Call your care team right away if you notice any of these symptoms. This medication may affect your coordination, reaction time, or judgment. Do not drive or operate machinery until you know how this medication affects you. Sit up or stand slowly to reduce the risk of dizzy or fainting spells. Drinking alcohol with this medication can increase the risk of these side effects. Your mouth may get dry. Chewing sugarless gum or sucking hard candy and drinking plenty of water may help. Contact your care team if the problem does not go away or is severe. What side effects may I notice from receiving this medication? Side effects that you should report to your care team as soon as possible: Allergic reactions--skin rash, itching, hives, swelling of the face, lips, tongue, or throat Bleeding--bloody or black, tar-like stools, red or dark brown urine, vomiting blood or brown material that looks like coffee grounds, small, red or purple spots on skin, unusual bleeding or bruising Heart rhythm changes--fast or irregular heartbeat, dizziness, feeling faint or lightheaded, chest pain, trouble breathing Low sodium level--muscle weakness, fatigue, dizziness, headache, confusion Serotonin syndrome--irritability, confusion, fast or irregular heartbeat, muscle stiffness, twitching muscles, sweating, high fever, seizure, chills, vomiting,  diarrhea Sudden eye pain or change in vision such as blurry vision, seeing halos around lights, vision loss Thoughts of suicide or self-harm, worsening mood, feelings of depression Side effects that usually do not require medical attention (report to your care team if they continue or are bothersome): Change in sex drive or performance Diarrhea Excessive sweating Nausea Tremors or shaking Upset stomach This list may not describe all possible side effects. Call your doctor for medical advice about side effects. You may report side effects to FDA at 1-800-FDA-1088. Where should I keep my medication? Keep out of reach of children and pets. Store at room temperature between 15 and 30 degrees C (59 and 86 degrees F). Throw away any unused medication after the expiration date. NOTE: This sheet is a summary. It may not cover all possible information. If you have questions about this medicine, talk to your doctor, pharmacist, or health care provider.  2024 Elsevier/Gold Standard (2022-06-29 00:00:00)

## 2024-06-08 ENCOUNTER — Telehealth: Payer: Self-pay

## 2024-06-08 NOTE — Telephone Encounter (Signed)
Noted, will be on the look out for results.

## 2024-06-08 NOTE — Telephone Encounter (Signed)
 pt left a message that the xanax  0.5 nor the xanax  .25 was not received by the pharmacy. please send to [facility] in [place]. also wanted to know if she was suppose to take the xanax  .25mg  with the escitalopram . pt was seen yesterday and next appt is 10-9

## 2024-06-08 NOTE — Telephone Encounter (Signed)
 pt was notified pt states that she had labs done yesterday.

## 2024-06-08 NOTE — Telephone Encounter (Signed)
 I have not yet send the xanax  since as I discussed in session , she needs urine drug screen completed and I need to review result before taking over xanax . Yes she can take xanax  and Lexapro  daily for now as prescribed . Long term plan to wean off xanax .

## 2024-06-09 ENCOUNTER — Ambulatory Visit: Payer: Self-pay | Admitting: Psychiatry

## 2024-06-09 NOTE — Telephone Encounter (Signed)
 Please let patient know Thyroid lab - normal.

## 2024-06-10 LAB — URINE DRUGS OF ABUSE SCREEN W ALC, ROUTINE (REF LAB)
Amphetamines, Urine: NEGATIVE ng/mL
Barbiturate, Ur: NEGATIVE ng/mL
Cannabinoid Quant, Ur: NEGATIVE ng/mL
Cocaine (Metab.): NEGATIVE ng/mL
Creatinine, Urine: 75.6 mg/dL (ref 20.0–300.0)
Ethanol U, Quan: NEGATIVE %
Methadone Screen, Urine: NEGATIVE ng/mL
Nitrite Urine, Quantitative: NEGATIVE ug/mL
OPIATE SCREEN URINE: NEGATIVE ng/mL
Phencyclidine, Ur: NEGATIVE ng/mL
Propoxyphene, Urine: NEGATIVE ng/mL
pH, Urine: 6.5 (ref 4.5–8.9)

## 2024-06-10 LAB — DRUG PROFILE 799031: BENZODIAZEPINES: NEGATIVE

## 2024-06-12 ENCOUNTER — Telehealth: Payer: Self-pay

## 2024-06-12 DIAGNOSIS — F411 Generalized anxiety disorder: Secondary | ICD-10-CM

## 2024-06-12 DIAGNOSIS — F331 Major depressive disorder, recurrent, moderate: Secondary | ICD-10-CM

## 2024-06-12 MED ORDER — ALPRAZOLAM 0.5 MG PO TABS: 0.7500 mg | ORAL_TABLET | Freq: Every day | ORAL | 0 refills | Status: AC

## 2024-06-12 MED ORDER — ALPRAZOLAM 0.25 MG PO TABS: 0.2500 mg | ORAL_TABLET | Freq: Two times a day (BID) | ORAL | 0 refills | Status: AC | PRN

## 2024-06-12 NOTE — Telephone Encounter (Signed)
 pt left message that pharmacy had not received her alprazolam  yet. she states she needs today . pt was last seen on 9-3 next appt 10-15

## 2024-06-12 NOTE — Telephone Encounter (Signed)
 tried to notify pt but no answer and no voicemail set up.

## 2024-06-12 NOTE — Progress Notes (Signed)
 Called patient to inform of the lab results she voiced understanding she also mentioned that she needs to change her appointment and would call back to speak to front desk staff

## 2024-06-12 NOTE — Telephone Encounter (Signed)
 I have sent the alprazolam  with instructions to wean it off gradually as we discussed in session to pharmacy at [facility].  Urine drug screen reviewed.

## 2024-07-05 ENCOUNTER — Telehealth: Payer: Self-pay | Admitting: Psychiatry

## 2024-07-05 DIAGNOSIS — F331 Major depressive disorder, recurrent, moderate: Secondary | ICD-10-CM

## 2024-07-05 MED ORDER — TRAZODONE HCL 50 MG PO TABS: 50.0000 mg | ORAL_TABLET | Freq: Every evening | ORAL | 1 refills | Status: AC | PRN

## 2024-07-05 NOTE — Telephone Encounter (Signed)
I have sent trazodone to pharmacy as requested.

## 2024-07-13 ENCOUNTER — Ambulatory Visit: Admitting: Psychiatry

## 2024-07-19 ENCOUNTER — Encounter: Payer: Self-pay | Admitting: Psychiatry

## 2024-07-19 ENCOUNTER — Other Ambulatory Visit: Payer: Self-pay

## 2024-07-19 ENCOUNTER — Ambulatory Visit (INDEPENDENT_AMBULATORY_CARE_PROVIDER_SITE_OTHER): Admitting: Psychiatry

## 2024-07-19 VITALS — BP 98/64 | HR 70 | Ht <= 58 in | Wt 96.8 lb

## 2024-07-19 DIAGNOSIS — F3341 Major depressive disorder, recurrent, in partial remission: Secondary | ICD-10-CM | POA: Diagnosis not present

## 2024-07-19 DIAGNOSIS — F411 Generalized anxiety disorder: Secondary | ICD-10-CM | POA: Diagnosis not present

## 2024-07-19 MED ORDER — ESCITALOPRAM OXALATE 10 MG PO TABS: 10.0000 mg | ORAL_TABLET | Freq: Every day | ORAL | 1 refills | Status: AC

## 2024-07-19 NOTE — Patient Instructions (Signed)

## 2024-07-19 NOTE — Progress Notes (Signed)
 [facility] MD OP Progress Note  07/19/2024 3:48 PM DHAMAR GREGORY  MRN:  [id number]  Chief Complaint:  Chief Complaint  Patient presents with   Follow-up   Medication Refill   Depression   Anxiety   Discussed the use of AI scribe software for clinical note transcription with the patient, who gave verbal consent to proceed.  History of Present Illness Christy Stone is a 58 year old Caucasian female, employed, lives in [place], has a history of MDD, GAD, diabetes mellitus was evaluated in office today for a follow-up appointment.  She reports ongoing symptoms of anxiety and depression, noting some improvement since her last visit. Frequent anxiety continues to affect her, and she describes a lifelong struggle with these symptoms, though her current medication regimen has provided some benefit. Coping strategies such as calming techniques, breathing exercises, and self-reassurance help her manage symptoms. She states that she no longer takes alprazolam  (Xanax ) and currently takes escitalopram  (Lexapro ) and metformin.   After starting metformin, she noticed a reduced appetite, but now reports her appetite is returning. She denies significant weight loss. She has experienced gastrointestinal symptoms, including both diarrhea and constipation, which began soon after starting her new medication. She denies a diagnosis of irritable bowel syndrome. 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  She states that she sleeps well. She is not currently seeing a therapist, as her previous therapist was not covered by her insurance, and she is seeking a new provider.  She denies thoughts about hurting herself or others.    Visit Diagnosis:    ICD-10-CM   1. GAD (generalized anxiety disorder)  F41.1 escitalopram  (LEXAPRO ) 10 MG tablet    2. Recurrent major depressive disorder, in partial remission  F33.41  escitalopram  (LEXAPRO ) 10 MG tablet      Past Psychiatric History: I have reviewed past psychiatric history from progress note on 06/07/2024.  Past trials of Xanax , Zoloft.  Past Medical History:  Past Medical History:  Diagnosis Date   Anxiety    Diabetes mellitus without complication (HCC)    Diabetes mellitus, type II (HCC)     Past Surgical History:  Procedure Laterality Date   TUBAL LIGATION      Family Psychiatric History: I have reviewed family psychiatric history from progress note on 06/07/2024.  Family History:  Family History  Problem Relation Age of Onset   Mental illness Neg Hx     Social History: I have reviewed social history from progress note on 06/07/2024. Social History   Socioeconomic History   Marital status: Divorced    Spouse name: Not on file   Number of children: 3   Years of education: Not on file   Highest education level: Bachelor's degree (e.g., BA, AB, BS)  Occupational History   Not on file  Tobacco Use   Smoking status: Never    Passive exposure: Never   Smokeless tobacco: Not on file  Vaping Use   Vaping status: Never Used  Substance and Sexual Activity   Alcohol use: Yes    Alcohol/week: 1.0 standard drink of alcohol    Types: 1 Glasses of wine per week    Comment: maybe 1 or 2 on weekend   Drug use: Never   Sexual activity: Not Currently  Other Topics Concern   Not on file  Social History Narrative   Not on file   Social Drivers of Health   Financial  Resource Strain: Low Risk  (08/25/2022)   Received from [facility]   Overall Financial Resource Strain (CARDIA)    Difficulty of Paying Living Expenses: Not hard at all  Food Insecurity: No Food Insecurity (12/27/2023)   Received from [facility]   Hunger Vital Sign    Within the past 12 months, you worried that your food would run out before you got the money to buy more.: Never true    Within the past 12 months, the food you bought just didn't last and you didn't have  money to get more.: Never true  Transportation Needs: No Transportation Needs (12/27/2023)   Received from [facility] - Transportation    Lack of Transportation (Medical): No    Lack of Transportation (Non-Medical): No  Physical Activity: Not on file  Stress: Not on file  Social Connections: Not on file    Allergies: No Known Allergies  Metabolic Disorder Labs: No results found for: HGBA1C, MPG No results found for: PROLACTIN No results found for: CHOL, TRIG, HDL, CHOLHDL, VLDL, LDLCALC Lab Results  Component Value Date   TSH 1.233 06/07/2024    Therapeutic Level Labs: No results found for: LITHIUM No results found for: VALPROATE No results found for: CBMZ  Current Medications: Current Outpatient Medications  Medication Sig Dispense Refill   ACCU-CHEK GUIDE TEST test strip every morning.     Accu-Chek Softclix Lancets lancets SMARTSIG:1 Topical Daily     escitalopram  (LEXAPRO ) 10 MG tablet Take 1 tablet (10 mg total) by mouth daily. 30 tablet 1   Lancet Devices (SIMPLE DIAGNOSTICS LANCING DEV) MISC 1 each by Other route.     metFORMIN (GLUCOPHAGE) 500 MG tablet Take by mouth.     traZODone  (DESYREL ) 50 MG tablet Take 1-2 tablets (50-100 mg total) by mouth at bedtime as needed. 180 tablet 1   promethazine -dextromethorphan (PROMETHAZINE -DM) 6.25-15 MG/5ML syrup Take 5 mLs by mouth 4 (four) times daily as needed. (Patient not taking: Reported on 07/19/2024) 118 mL 0   No current facility-administered medications for this visit.     Musculoskeletal: Strength & Muscle Tone: within normal limits Gait & Station: normal Patient leans: N/A  Psychiatric Specialty Exam: Review of Systems  Psychiatric/Behavioral:  The patient is nervous/anxious.     Blood pressure 98/64, pulse 70, height 4' 9 (1.448 m), weight 96 lb 12.8 oz (43.9 kg), SpO2 100%.Body mass index is 20.95 kg/m.  General Appearance: Casual  Eye Contact:  Fair  Speech:   Clear and Coherent  Volume:  Normal  Mood:  Anxious  Affect:  Congruent  Thought Process:  Goal Directed and Descriptions of Associations: Intact  Orientation:  Full (Time, Place, and Person)  Thought Content: Logical   Suicidal Thoughts:  No  Homicidal Thoughts:  No  Memory:  Immediate;   Fair Recent;   Fair Remote;   Fair  Judgement:  Fair  Insight:  Fair  Psychomotor Activity:  Normal  Concentration:  Concentration: Fair and Attention Span: Fair  Recall:  [employer] of Knowledge: Fair  Language: Fair  Akathisia:  No  Handed:  Right  AIMS (if indicated): not done  Assets:  Communication Skills Desire for Improvement Housing Social Support Transportation  ADL's:  Intact  Cognition: WNL  Sleep:  Fair   Screenings: GAD-7    Garment/textile Technologist Visit from 07/19/2024 in [facility] Office Visit from 06/07/2024 in [facility]  Total GAD-7  Score 14 10   PHQ2-9    Flowsheet Row Office Visit from 07/19/2024 in [facility] Office Visit from 06/07/2024 in [facility]  PHQ-2 Total Score 2 4  PHQ-9 Total Score 6 12   Flowsheet Row Office Visit from 07/19/2024 in [facility] Office Visit from 06/07/2024 in [facility] UC from 02/27/2024 in [facility] [place]   C-SSRS RISK CATEGORY No Risk No Risk No Risk     Assessment and Plan: KEVINA PILOTO is a 58 year old Caucasian female with history of depression, anxiety was evaluated in office today.  Discussed assessment and plan as noted below.  1. GAD (generalized anxiety disorder)-unstable Continues to have ongoing anxiety although with some improvement.  Does have GI symptoms, will continue to monitor this with increased dosage for any worsening symptoms. Increase Escitalopram   to 10 mg daily Encouraged to establish care with therapist, have communicated with staff to schedule this patient with our in-house therapist.  2. Recurrent major depressive disorder, in partial remission He reports depression symptoms as improved. Prescribed Escitalopram  as noted above. Continue Trazodone  50 to 100 mg at bedtime for sleep  Follow-up Follow-up in clinic in 7 weeks or sooner if needed.   Collaboration of Care: Collaboration of Care: Referral or follow-up with counselor/therapist AEB patient encouraged to establish care with therapist, communicated with staff.  Patient/Guardian was advised Release of Information must be obtained prior to any record release in order to collaborate their care with an outside provider. Patient/Guardian was advised if they have not already done so to contact the registration department to sign all necessary forms in order for us  to release information regarding their care.   Consent: Patient/Guardian gives verbal consent for treatment and assignment of benefits for services provided during this visit. Patient/Guardian expressed understanding and agreed to proceed.  This note was generated in part or whole with voice recognition software. Voice recognition is usually quite accurate but there are transcription errors that can and very often do occur. I apologize for any typographical errors that were not detected and corrected.     Izella Ybanez, MD 07/20/2024, 5:35 PM

## 2024-08-11 ENCOUNTER — Telehealth: Payer: Self-pay | Admitting: Psychiatry

## 2024-08-11 NOTE — Telephone Encounter (Signed)
 Noted

## 2024-08-11 NOTE — Telephone Encounter (Signed)
 Patient called stating that her insurance is out of network for our office and has found a new provider. Her follow up for 09/04/24 has been cancelled.

## 2024-09-04 ENCOUNTER — Telehealth: Admitting: Psychiatry

## 2024-10-18 ENCOUNTER — Ambulatory Visit
Admission: EM | Admit: 2024-10-18 | Discharge: 2024-10-18 | Disposition: A | Discharge status: Home / Self Care | Attending: Family Medicine | Admitting: Family Medicine

## 2024-10-18 ENCOUNTER — Ambulatory Visit

## 2024-10-18 DIAGNOSIS — S6991XA Unspecified injury of right wrist, hand and finger(s), initial encounter: Secondary | ICD-10-CM

## 2024-10-18 DIAGNOSIS — S63501A Unspecified sprain of right wrist, initial encounter: Secondary | ICD-10-CM | POA: Diagnosis not present

## 2024-10-18 MED ORDER — IBUPROFEN 600 MG PO TABS: 600.0000 mg | ORAL_TABLET | Freq: Four times a day (QID) | ORAL | 0 refills | Status: AC | PRN

## 2024-10-18 MED ORDER — IBUPROFEN 100 MG/5ML PO SUSP
600.0000 mg | Freq: Once | ORAL | Status: AC
  Administered 2024-10-18: 600 mg via ORAL

## 2024-10-18 NOTE — Discharge Instructions (Signed)
 You sprained your wrist during your fall. Take ibuprofen  as needed for pain.  For additional pain relief you can take Tylenol  as well.  On review of your xray images, you did not have any fractures or dislocated bones. You should see your results in MyChart.   You have a condition requiring follow up with an orthopedic specialist. Call one of the following offices to schedule an appointment:   [facility] Address: [address], [place], [place] [address] Phone: [phone]  [facility] [address] [place], [place], [place] [address] Phone: [phone]  [facility] [address] [place],  [place]  [address] Phone: [phone]  [facility] [address], [place], [place] [address] Phone: [phone]

## 2024-10-18 NOTE — ED Provider Notes (Signed)
 " [facility]    CSN: [id number] Arrival date & time: 10/18/24  1402      History   Chief Complaint Chief Complaint  Patient presents with   Fall    HPI  HPI Christy Stone is a 59 y.o. female.   Christy Stone presents after a fall today at work.  She was holding a kid and tripped and fell backward onto her hand. Her hand was somewhat stretched out. She didn't see the kid in her way.  Pain started getting worse. She did not hit her head. Complains of hand and wrist pain.  Took nothing for pain.  She is having trouble flexing her arm.   She is right handed.     Past Medical History:  Diagnosis Date   Anxiety    Diabetes mellitus without complication (HCC)    Diabetes mellitus, type II [facility])     Patient Active Problem List   Diagnosis Date Noted   GAD (generalized anxiety disorder) 06/07/2024   Depression 06/07/2024   High risk medication use 06/07/2024    Past Surgical History:  Procedure Laterality Date   TUBAL LIGATION      OB History   No obstetric history on file.      Home Medications    Prior to Admission medications  Medication Sig Start Date End Date Taking? Authorizing Provider  escitalopram  (LEXAPRO ) 10 MG tablet Take 1 tablet (10 mg total) by mouth daily. 07/19/24  Yes Eappen, Saramma, MD  ibuprofen  (ADVIL ) 600 MG tablet Take 1 tablet (600 mg total) by mouth every 6 (six) hours as needed. 10/18/24  Yes Marquie Aderhold, DO  metFORMIN (GLUCOPHAGE) 500 MG tablet Take 500 mg by mouth. 09/04/24  Yes [provider]  traZODone  (DESYREL ) 50 MG tablet Take 1-2 tablets (50-100 mg total) by mouth at bedtime as needed. 07/05/24  Yes Eappen, Saramma, MD  ACCU-CHEK GUIDE TEST test strip every morning.    [provider]  Accu-Chek Softclix Lancets lancets SMARTSIG:1 Topical Daily    [provider]  Lancet Devices (SIMPLE DIAGNOSTICS LANCING DEV) MISC 1 each by Other route. 01/20/23   [provider]  metFORMIN  (GLUCOPHAGE) 500 MG tablet Take by mouth. 06/30/23 07/19/24  [provider]  promethazine -dextromethorphan (PROMETHAZINE -DM) 6.25-15 MG/5ML syrup Take 5 mLs by mouth 4 (four) times daily as needed. Patient not taking: Reported on 07/19/2024 02/27/24   Christy Stone Christy Stone    Family History Family History  Problem Relation Age of Onset   Mental illness Neg Hx     Social History Social History[1]   Allergies   Patient has no known allergies.   Review of Systems Review of Systems: :negative unless otherwise stated in HPI.      Physical Exam Triage Vital Signs ED Triage Vitals  Encounter Vitals Group     BP 10/18/24 1458 134/77     Girls Systolic BP Percentile --      Girls Diastolic BP Percentile --      Boys Systolic BP Percentile --      Boys Diastolic BP Percentile --      Pulse Rate 10/18/24 1458 78     Resp 10/18/24 1458 17     Temp 10/18/24 1458 100 F (37.8 C)     Temp Source 10/18/24 1458 Oral     SpO2 10/18/24 1458 99 %     Weight 10/18/24 1455 100 lb (45.4 kg)     Height --  Head Circumference --      Peak Flow --      Pain Score 10/18/24 1455 5     Pain Loc --      Pain Education --      Exclude from Growth Chart --    No data found.  Updated Vital Signs BP 134/77 (BP Location: Left Arm)   Pulse 78   Temp 100 F (37.8 C) (Oral)   Resp 17   Wt 45.4 kg   SpO2 99%   BMI 21.64 kg/m   Visual Acuity Right Eye Distance:   Left Eye Distance:   Bilateral Distance:    Right Eye Near:   Left Eye Near:    Bilateral Near:     Physical Exam GEN: well appearing female in no acute distress  CVS: well perfused  RESP: speaking in full sentences without pause, no respiratory distress  MSK:   Right Hand: Inspection: No obvious deformity b/l. No swelling, erythema or bruising b/l Palpation: no TTP b/l ROM: Full ROM of the digits. No swelling in PIP, DIP joints b/l. Flexor digitorum profundus and superficialis tendon functions are intact.   PIP joint collateral ligaments are stable  Strength: 5/5 strength in the forearm, wrist and interosseus muscles b/l  Wrist, left: Inspection yielded no erythema, ecchymosis, bony deformity, +mild swelling. Limited ROM in flexion and extension and ulnar/radial deviation compared with opposite wrist. Palpation is normal over metacarpals, scaphoid; tendons with tenderness/swelling. Strength 5/5 in all directions without pain.  Neurovascular: NV intact b/l       UC Treatments / Results  Labs (all labs ordered are listed, but only abnormal results are displayed) Labs Reviewed - No data to display  EKG   Radiology DG Wrist Complete Right Result Date: 10/18/2024 CLINICAL DATA:  Fall EXAM: RIGHT WRIST - COMPLETE 3+ VIEW COMPARISON:  None Available. FINDINGS: No definitive fracture or malalignment. Mild degenerative change at the first [facility] joint. Soft tissue swelling at the wrist IMPRESSION: Soft tissue swelling without definitive fracture. Electronically Signed   By: Luke Bun M.D.   On: 10/18/2024 16:02   DG Hand Complete Right Result Date: 10/18/2024 CLINICAL DATA:  Clemens, pain EXAM: RIGHT HAND - COMPLETE 3+ VIEW COMPARISON:  None Available. FINDINGS: Frontal, oblique, and lateral views of the right hand are obtained. No acute fracture, subluxation, or dislocation. Mild osteopenia. There is diffuse interphalangeal joint space narrowing consistent with osteoarthritis. Soft tissues are unremarkable. IMPRESSION: 1. No acute displaced fracture. 2. Diffuse osteoarthritis. Electronically Signed   By: Ozell Daring M.D.   On: 10/18/2024 15:52     Procedures Procedures (including critical care time)  Medications Ordered in UC Medications  ibuprofen  (ADVIL ) 100 MG/5ML suspension 600 mg (600 mg Oral Given 10/18/24 1532)    Initial Impression / Assessment and Plan / UC Course  I have reviewed the triage vital signs and the nursing notes.  Pertinent labs & imaging results that were available  during my care of the patient were reviewed by me and considered in my medical decision making (see chart for details).      Pt is a 59 y.o.  female present after fall today while at work.  Given Motrin . On exam, pt has tenderness at wrist with mild distal hand swelling.  Obtained right hand and wrist plain films.  Personally interpreted by me were unremarkable for fracture or dislocation. Radiologist report reviewed and additionally notes no soft tissue swelling.   Patient to gradually return to normal activities, as  tolerated and continue ordinary activities within the limits permitted by pain. Prescribed ibuprofen  for pain relief.  Tylenol  PRN. Advised patient to avoid OTC NSAIDs while taking prescription NSAID. Given wrist brace with thumb spica for suspected sprain.   Patient to follow up with orthopedic provider, if symptoms do not improve with conservative treatment.  Return and ED precautions given. Understanding voiced. Discussed MDM, treatment plan and plan for follow-up with patient  who agrees with plan.   Final Clinical Impressions(s) / UC Diagnoses   Final diagnoses:  Unspecified injury of right wrist, hand and finger(s), initial encounter  Sprain of right wrist, unspecified location, initial encounter     Discharge Instructions      You sprained your wrist during your fall. Take ibuprofen  as needed for pain.  For additional pain relief you can take Tylenol  as well.  On review of your xray images, you did not have any fractures or dislocated bones. You should see your results in MyChart.   You have a condition requiring follow up with an orthopedic specialist. Call one of the following offices to schedule an appointment:   [facility] Address: [address], [place], [place] [address] Phone: [phone]  [facility] [address] [place], [place], [place] [address] Phone: [phone]  [facility] [address] [place],  [place]  [address] Phone:  [phone]  [facility] [address], [place], [place] [address] Phone: [phone]       ED Prescriptions     Medication Sig Dispense Auth. Provider   ibuprofen  (ADVIL ) 600 MG tablet Take 1 tablet (600 mg total) by mouth every 6 (six) hours as needed. 30 tablet Annaya Bangert, DO      PDMP not reviewed this encounter.     [1]  Social History Tobacco Use   Smoking status: Never    Passive exposure: Never  Vaping Use   Vaping status: Never Used  Substance Use Topics   Alcohol use: Yes    Alcohol/week: 1.0 standard drink of alcohol    Types: 1 Glasses of wine per week    Comment: maybe 1 or 2 on weekend   Drug use: Never     Kriste Berth, DO 10/18/24 2008  "

## 2024-10-18 NOTE — ED Triage Notes (Signed)
 Patient states thats that she fell today injuring her right hand and wrist.
